# Patient Record
Sex: Male | Born: 1965 | Race: White | Hispanic: No | Marital: Married | Smoking: Current every day smoker
Health system: Southern US, Community
[De-identification: ages and names within clinical notes are randomized; demographics above are authoritative.]

## PROBLEM LIST (undated history)

## (undated) DIAGNOSIS — F411 Generalized anxiety disorder: Secondary | ICD-10-CM

## (undated) DIAGNOSIS — K219 Gastro-esophageal reflux disease without esophagitis: Secondary | ICD-10-CM

## (undated) DIAGNOSIS — F410 Panic disorder [episodic paroxysmal anxiety] without agoraphobia: Secondary | ICD-10-CM

## (undated) DIAGNOSIS — T7840XA Allergy, unspecified, initial encounter: Secondary | ICD-10-CM

## (undated) HISTORY — DX: Allergy, unspecified, initial encounter: T78.40XA

## (undated) HISTORY — DX: Gastro-esophageal reflux disease without esophagitis: K21.9

## (undated) HISTORY — DX: Generalized anxiety disorder: F41.1

## (undated) HISTORY — DX: Panic disorder (episodic paroxysmal anxiety): F41.0

## (undated) HISTORY — PX: TONSILLECTOMY AND ADENOIDECTOMY: SUR1326

## (undated) HISTORY — PX: HERNIA REPAIR: SHX51

---

## 1997-06-10 ENCOUNTER — Encounter: Payer: Self-pay | Admitting: Family Medicine

## 1997-06-10 LAB — CONVERTED CEMR LAB: Blood Glucose, Fasting: 86 mg/dL

## 1998-07-09 ENCOUNTER — Encounter: Payer: Self-pay | Admitting: Family Medicine

## 1998-07-09 LAB — CONVERTED CEMR LAB: Rapid Strep: NEGATIVE

## 2002-04-12 ENCOUNTER — Encounter: Payer: Self-pay | Admitting: Family Medicine

## 2002-04-12 LAB — CONVERTED CEMR LAB: Rapid Strep: NEGATIVE

## 2004-12-09 ENCOUNTER — Ambulatory Visit: Payer: Self-pay | Admitting: Family Medicine

## 2005-01-04 ENCOUNTER — Ambulatory Visit: Payer: Self-pay | Admitting: Family Medicine

## 2005-12-03 ENCOUNTER — Ambulatory Visit: Payer: Self-pay | Admitting: Family Medicine

## 2008-06-03 ENCOUNTER — Ambulatory Visit: Payer: Self-pay | Admitting: Family Medicine

## 2008-06-03 DIAGNOSIS — R079 Chest pain, unspecified: Secondary | ICD-10-CM | POA: Insufficient documentation

## 2008-06-04 ENCOUNTER — Encounter: Payer: Self-pay | Admitting: Family Medicine

## 2008-06-04 DIAGNOSIS — G43909 Migraine, unspecified, not intractable, without status migrainosus: Secondary | ICD-10-CM | POA: Insufficient documentation

## 2008-06-04 DIAGNOSIS — J309 Allergic rhinitis, unspecified: Secondary | ICD-10-CM | POA: Insufficient documentation

## 2008-06-12 ENCOUNTER — Ambulatory Visit: Payer: Self-pay | Admitting: Family Medicine

## 2008-06-17 ENCOUNTER — Telehealth: Payer: Self-pay | Admitting: Family Medicine

## 2008-06-21 ENCOUNTER — Ambulatory Visit: Payer: Self-pay | Admitting: Family Medicine

## 2008-06-21 DIAGNOSIS — K219 Gastro-esophageal reflux disease without esophagitis: Secondary | ICD-10-CM | POA: Insufficient documentation

## 2008-06-24 ENCOUNTER — Ambulatory Visit: Payer: Self-pay | Admitting: Family Medicine

## 2008-06-24 DIAGNOSIS — R5381 Other malaise: Secondary | ICD-10-CM

## 2008-06-24 DIAGNOSIS — R5383 Other fatigue: Secondary | ICD-10-CM

## 2008-06-25 LAB — CONVERTED CEMR LAB
ALT: 56 units/L — ABNORMAL HIGH (ref 0–53)
Albumin: 3.8 g/dL (ref 3.5–5.2)
Alkaline Phosphatase: 79 units/L (ref 39–117)
Basophils Absolute: 0 10*3/uL (ref 0.0–0.1)
Bilirubin, Direct: 0.1 mg/dL (ref 0.0–0.3)
CO2: 28 meq/L (ref 19–32)
Chloride: 109 meq/L (ref 96–112)
Creatinine, Ser: 1 mg/dL (ref 0.4–1.5)
Eosinophils Absolute: 0.5 10*3/uL (ref 0.0–0.7)
Glucose, Bld: 91 mg/dL (ref 70–99)
Lymphocytes Relative: 27 % (ref 12.0–46.0)
MCHC: 35.3 g/dL (ref 30.0–36.0)
Neutrophils Relative %: 56.3 % (ref 43.0–77.0)
RBC: 4.6 M/uL (ref 4.22–5.81)
RDW: 11.6 % (ref 11.5–14.6)
TSH: 1.08 microintl units/mL (ref 0.35–5.50)
Total CHOL/HDL Ratio: 4
Total Protein: 6.8 g/dL (ref 6.0–8.3)
Triglycerides: 90 mg/dL (ref 0.0–149.0)

## 2008-06-26 ENCOUNTER — Telehealth: Payer: Self-pay | Admitting: Family Medicine

## 2008-07-18 ENCOUNTER — Ambulatory Visit: Payer: Self-pay | Admitting: Internal Medicine

## 2008-07-19 ENCOUNTER — Ambulatory Visit: Payer: Self-pay | Admitting: Family Medicine

## 2008-07-19 DIAGNOSIS — M545 Low back pain: Secondary | ICD-10-CM

## 2009-01-02 ENCOUNTER — Ambulatory Visit: Payer: Self-pay | Admitting: Family Medicine

## 2009-02-19 ENCOUNTER — Telehealth: Payer: Self-pay | Admitting: Family Medicine

## 2009-10-20 ENCOUNTER — Encounter (INDEPENDENT_AMBULATORY_CARE_PROVIDER_SITE_OTHER): Payer: Self-pay | Admitting: *Deleted

## 2009-11-20 ENCOUNTER — Telehealth (INDEPENDENT_AMBULATORY_CARE_PROVIDER_SITE_OTHER): Payer: Self-pay | Admitting: *Deleted

## 2009-11-21 ENCOUNTER — Ambulatory Visit: Payer: Self-pay | Admitting: Family Medicine

## 2009-11-24 LAB — CONVERTED CEMR LAB
Cholesterol: 194 mg/dL (ref 0–200)
Triglycerides: 107 mg/dL (ref 0.0–149.0)

## 2009-11-28 ENCOUNTER — Ambulatory Visit: Payer: Self-pay | Admitting: Family Medicine

## 2010-04-14 NOTE — Assessment & Plan Note (Signed)
Summary: CPX/DR [facility]'S PT/CLE   Vital Signs:  Patient profile:   45 year old male Height:      67.75 inches Weight:      177.75 pounds BMI:     27.33 Temp:     98.5 degrees F oral Pulse rate:   76 / minute Pulse rhythm:   regular BP sitting:   108 / 72  (left arm) Cuff size:   regular  Vitals Entered By: Delilah Shan CMA Duncan Dull) (November 28, 2009 8:53 AM) CC: CPX (RNS), Headache   History of Present Illness: CPE- see prev med.  Migraines, under control.  Was told he didn't need to follow up at [facility].  Now with 1-2/month.  Good effect with imitrex.  H/p photo/phonophobia, pain in neck and pain over 1 eye or the other.  No aura.   Needs refill on imitrex.   Going on cruise on a month.  Needs motion sickness patch.  Going to [employer].   Allergies: No Known Drug Allergies  Past History:  Family History: Last updated: 11/28/2009 Father A   HTN:   Mother: A  SISTER A CV: POSITIVE PGF DECEASED FROM MI HBP: POSITIVE FATHER ; +PGF AND +PGM DM: + PGF AND + PGM GOUT/ARTHRITIS: PROSTATE CANCER: BREAST/OVARIAN/UTERINE CANCER: NEGATIVE DEPRESSION: NEGATIVE ETOH/DRUG ABUSE: NEGATIVE OTHER: NEGATIVE STROKE No FH of Colon Cancer:  Social History: Last updated: 11/28/2009 Marital Status: Married 1991// LIVES WITH WIFE Children: 2 CHILDREN Occupation: Scientist, research (physical sciences), INJECTION MOLD SETUP MACHINERY Patient prev smoked  Alcohol Use - yes, occ 1 Daily Caffeine Use Illicit Drug Use - no Patient does get regular exercise, walking [employer] fan  Past Medical History: Allergic rhinitis:(05/1997) Migraines H/o GERD  Past Surgical History: headache center workup :(02/14/2003)(DR. ADELMAN) Hernia Surgery: 1997 T&A as a child  Family History: Reviewed history from 07/18/2008 and no changes required. Father A   HTN:   Mother: A  SISTER A CV: POSITIVE PGF DECEASED FROM MI HBP: POSITIVE FATHER ; +PGF AND +PGM DM: + PGF AND + PGM GOUT/ARTHRITIS: PROSTATE  CANCER: BREAST/OVARIAN/UTERINE CANCER: NEGATIVE DEPRESSION: NEGATIVE ETOH/DRUG ABUSE: NEGATIVE OTHER: NEGATIVE STROKE No FH of Colon Cancer:  Social History: Reviewed history from 07/18/2008 and no changes required. Marital Status: Married 1991// LIVES WITH WIFE Children: 2 CHILDREN Occupation: [employer], INJECTION MOLD SETUP MACHINERY Patient prev smoked  Alcohol Use - yes, occ 1 Daily Caffeine Use Illicit Drug Use - no Patient does get regular exercise, walking [employer] fan  Review of Systems       See HPI.  Otherwise negative.    Physical Exam  General:  GEN: nad, alert and oriented HEENT: mucous membranes moist NECK: supple w/o LA CV: rrr.  no murmur PULM: ctab, no inc wob ABD: soft, +bs EXT: no edema SKIN: no acute rash    Impression & Recommendations:  Problem # 1:  Preventive Health Care (ICD-V70.0) Flu shot encouraged along with diet/exercise.  labs reviewed w/pt.  No indication for early prostate, colon CA screening.  patient to consider flu shot.   Scopolamine patch with routine instructions (constipation/sedation) given for upcoming trip.   Problem # 2:  MIGRAINE HEADACHE (ICD-346.90) No change in meds.  stopped smoking.  d/w patient DG:UYQI and soda consumption.  His updated medication list for this problem includes:    Imitrex 100 Mg Tabs (Sumatriptan succinate) .Marland Kitchen... As needed migraines  Complete Medication List: 1)  Imitrex 100 Mg Tabs (Sumatriptan succinate) .... As needed migraines 2)  Transderm-scop 1.5 Mg Pt72 (Scopolamine base) .Marland KitchenMarland KitchenMarland Kitchen 1  patch q3 days as needed motion sickness during cruise   Patient Instructions: 1)  I sent your meds to [facility].  Think about getting a flu shot.  Take care.  Let me know if you have other concerns.  Enjoy the cruise.  Prescriptions: IMITREX 100 MG TABS (SUMATRIPTAN SUCCINATE) as needed MIGRAINES  #9 x 12   Entered and Authorized by:   Crawford Givens MD   Signed by:   Crawford Givens MD on 11/28/2009   Method used:    Electronically to        [facility]* (retail)       [address]       [place], [place]  [address]       Ph: [phone]       Fax: [phone]   RxID:   [id number] TRANSDERM-SCOP 1.5 MG PT72 (SCOPOLAMINE BASE) 1 patch q3 days as needed motion sickness during cruise  #4 x 1   Entered and Authorized by:   Crawford Givens MD   Signed by:   Crawford Givens MD on 11/28/2009   Method used:   Electronically to        [facility]* (retail)       [address]       [place], [place]  [address]       Ph: [phone]       Fax: [phone]   RxID:   [id number]   Current Allergies (reviewed today): No known allergies

## 2010-04-14 NOTE — Progress Notes (Signed)
----   Converted from flag ---- ---- 11/19/2009 1:13 PM, Crawford Givens MD wrote: glucose lipid v70.0  ---- 11/19/2009 1:12 PM, Liane Comber CMA (AAMA) wrote: Lab orders please! Good Morning! This pt is scheduled for cpx labs Friday, which labs to draw and dx codes to use? Thanks Tasha ------------------------------

## 2010-04-14 NOTE — Letter (Signed)
Summary: David Sheppard letter  [place] [facility]  [address] [place], [place] [address]   Phone: [phone]  Fax: [phone]       10/20/2009 MRN: [id number]  David Sheppard [address] [place], [place]  [address]  Dear Mr. David Sheppard [facility] [place] [place] announce the retirement of Arta Silence, M.D., from full-time practice at the [facility] office effective September 11, 2009 and his plans of returning part-time.  It is important to Dr. Hetty Ely and to our practice that you understand that [facility] has seven physicians in our office for your health care needs.  We will continue to offer the same exceptional care that you have today.    Dr. Hetty Ely has spoken to many of you about his plans for retirement and returning part-time in the fall.   We will continue to work with you through the transition to schedule appointments for you in the office and meet the high standards that [facility] is committed to.   Again, it is with great pleasure that we share the news that Dr. Hetty Ely will return to [facility] in October of 2011 with a reduced schedule.    If you have any questions, or would like to request an appointment with one of our physicians, please call us at [phone] and press the option for Scheduling an appointment.  We take pleasure in providing you with excellent patient care and look forward to seeing you at your next office visit.  Our [facility] Physicians are:  Tillman Abide, M.D. Laurita Quint, M.D. Roxy Manns, M.D. Kerby Nora, M.D. Hannah Beat, M.D. Ruthe Mannan, M.D. We proudly welcomed Raechel Ache, M.D. and Eustaquio Boyden, M.D. to the practice in July/August 2011.  Sincerely,  [facility]

## 2010-12-04 ENCOUNTER — Other Ambulatory Visit: Payer: Self-pay | Admitting: *Deleted

## 2010-12-04 MED ORDER — SUMATRIPTAN SUCCINATE 100 MG PO TABS: 100.0000 mg | ORAL_TABLET | Freq: Once | ORAL | Status: DC | PRN

## 2010-12-04 NOTE — Telephone Encounter (Signed)
Pt has not been seen in a year.Marland Kitchen Await Dr. Loni Muse recommendations upon his return.  Likely needs CPX to be scheduled first.

## 2010-12-04 NOTE — Telephone Encounter (Signed)
Patient not seen since 08/2009.

## 2010-12-04 NOTE — Telephone Encounter (Signed)
Patient advised to schedule CPE.  Patient says he switched his care to Dr. Patsy Lager but will schedule CPE with him before next refill is needed.

## 2010-12-04 NOTE — Telephone Encounter (Signed)
Can have one month of 9/0 and see me for more, preferably a CPX with labs prior

## 2010-12-18 ENCOUNTER — Encounter: Payer: Self-pay | Admitting: Family Medicine

## 2010-12-21 ENCOUNTER — Ambulatory Visit (INDEPENDENT_AMBULATORY_CARE_PROVIDER_SITE_OTHER): Payer: BC Managed Care – PPO | Admitting: Family Medicine

## 2010-12-21 ENCOUNTER — Encounter: Payer: Self-pay | Admitting: Family Medicine

## 2010-12-21 VITALS — BP 104/70 | HR 80 | Temp 98.6°F | Ht 67.75 in | Wt 179.0 lb

## 2010-12-21 DIAGNOSIS — G43909 Migraine, unspecified, not intractable, without status migrainosus: Secondary | ICD-10-CM

## 2010-12-21 MED ORDER — SUMATRIPTAN SUCCINATE 100 MG PO TABS: 100.0000 mg | ORAL_TABLET | ORAL | Status: DC | PRN

## 2010-12-21 NOTE — Progress Notes (Signed)
  Subjective:    Patient ID: David Sheppard, male    DOB: 29-Sep-1965, 45 y.o.   MRN: [id number]  HPI  David Sheppard, a 45 y.o. male presents today in the office for the following:    Feeling fine -  Migraines are fairly stable he will have anywhere to none for several weeks to 3 a week. Sometimes correlated with allergy season as well.  The PMH, PSH, Social History, Family History, Medications, and allergies have been reviewed in [facility], and have been updated if relevant.   Review of Systems ROS: GEN: No acute illnesses, no fevers, chills. GI: No n/v/d, eating normally Pulm: No SOB Interactive and getting along well at home.  Otherwise, ROS is as per the HPI.     Objective:   Physical Exam   Physical Exam  Blood pressure 104/70, pulse 80, temperature 98.6 F (37 C), temperature source Oral, height 5' 7.75" (1.721 m), weight 179 lb (81.194 kg).  GEN: WDWN, NAD, Non-toxic, A & O x 3 HEENT: Atraumatic, Normocephalic. Neck supple. No masses, No LAD. Ears and Nose: No external deformity. CV: RRR, No M/G/R. No JVD. No thrill. No extra heart sounds. PULM: CTA B, no wheezes, crackles, rhonchi. No retractions. No resp. distress. No accessory muscle use. EXTR: No c/c/e NEURO Normal gait.  PSYCH: Normally interactive. Conversant. Not depressed or anxious appearing.  Calm demeanor.       Assessment & Plan:   1. MIGRAINE HEADACHE  SUMAtriptan (IMITREX) 100 MG tablet

## 2011-01-04 ENCOUNTER — Other Ambulatory Visit: Payer: Self-pay | Admitting: *Deleted

## 2011-01-04 DIAGNOSIS — G43909 Migraine, unspecified, not intractable, without status migrainosus: Secondary | ICD-10-CM

## 2011-01-04 MED ORDER — SUMATRIPTAN SUCCINATE 100 MG PO TABS: 100.0000 mg | ORAL_TABLET | ORAL | Status: DC | PRN

## 2011-01-04 NOTE — Telephone Encounter (Signed)
Received faxed refill request from pharmacy for Sumatriptan 100 mg, directions on request is take one tablet by mouth daily as needed for migraine. Is it okay to refill medication?

## 2011-01-04 NOTE — Telephone Encounter (Signed)
Yes

## 2011-02-23 ENCOUNTER — Encounter: Payer: Self-pay | Admitting: Family Medicine

## 2011-02-23 ENCOUNTER — Ambulatory Visit (INDEPENDENT_AMBULATORY_CARE_PROVIDER_SITE_OTHER): Payer: BC Managed Care – PPO | Admitting: Family Medicine

## 2011-02-23 VITALS — BP 120/72 | HR 78 | Temp 98.1°F | Ht 68.0 in | Wt 179.8 lb

## 2011-02-23 DIAGNOSIS — R6889 Other general symptoms and signs: Secondary | ICD-10-CM

## 2011-02-23 DIAGNOSIS — J209 Acute bronchitis, unspecified: Secondary | ICD-10-CM

## 2011-02-23 MED ORDER — AZITHROMYCIN 250 MG PO TABS: ORAL_TABLET | ORAL | Status: AC

## 2011-02-23 MED ORDER — HYDROCODONE-HOMATROPINE 5-1.5 MG/5ML PO SYRP: ORAL_SOLUTION | ORAL | Status: AC

## 2011-02-23 NOTE — Progress Notes (Signed)
  Patient Name: David Sheppard Date of Birth: [birth year] Age: 45 y.o. Medical Record Number: [id number] Gender: male  History of Present Illness:  David Sheppard is a 45 y.o. very pleasant male patient who presents with the following:  Started as a sore throat and has lingered on his chest. Sick for 2 weeks -- worst part is all in his chest. Yesterday, had a lot of sore throat and really bad congestion. A couple of times trouble sleeping at night.  Last week maybe a fever. Last week legs were aching really bad.  No n/v/d  Past Medical History, Surgical History, Social History, Family History, and Problem List have been reviewed in EHR and updated if relevant.  Review of Systems: ROS: GEN: Acute illness details above GI: Tolerating PO intake GU: maintaining adequate hydration and urination Pulm: No SOB Interactive and getting along well at home.  Otherwise, ROS is as per the HPI.  Physical Examination: Filed Vitals:   02/23/11 1523  BP: 120/72  Pulse: 78  Temp: 98.1 F (36.7 C)  TempSrc: Oral  Height: 5\' 8"  (1.727 m)  Weight: 179 lb 12.8 oz (81.557 kg)  SpO2: 98%    Body mass index is 27.34 kg/(m^2).   Gen: WDWN, NAD; A & O x3, cooperative. Pleasant.Globally Non-toxic HEENT: Normocephalic and atraumatic. Throat clear, w/o exudate, R TM clear, L TM - good landmarks, No fluid present. rhinnorhea. No frontal or maxillary sinus T. MMM NECK: Anterior cervical  LAD is present CV: RRR, No M/G/R, cap refill <2 sec PULM: Breathing comfortably in no respiratory distress. no wheezing, crackles, rhonchi ABD: S,NT,ND,+BS. No HSM. No rebound. EXT: No c/c/e PSYCH: Friendly, good eye contact MSK: Nml gait   Assessment and Plan:  1. Flu-like symptoms   2. Bronchitis, acute    Resolving flu vs bronchitis - unclear by history For now, symptomatically treat, hycodan at night. Robitussin during day.  If not resolving by weekend, fill zpak

## 2011-03-04 ENCOUNTER — Telehealth: Payer: Self-pay | Admitting: Internal Medicine

## 2011-03-04 ENCOUNTER — Other Ambulatory Visit: Payer: Self-pay | Admitting: Family Medicine

## 2011-03-04 MED ORDER — OSELTAMIVIR PHOSPHATE 75 MG PO CAPS: 75.0000 mg | ORAL_CAPSULE | Freq: Two times a day (BID) | ORAL | Status: AC

## 2011-03-04 NOTE — Telephone Encounter (Signed)
Influenza. tamiflu sent in for him

## 2011-03-04 NOTE — Telephone Encounter (Signed)
i'll take care of this myself.

## 2011-03-04 NOTE — Progress Notes (Signed)
D/w patient. Sx c/w flu onset yesterday, myalgia, cough, severe with 102 fever. tamiflu Hycodan oow

## 2011-03-04 NOTE — Telephone Encounter (Signed)
Call patient, i need more information. Did he fill his zpak, fever, other symptoms?

## 2011-03-04 NOTE — Telephone Encounter (Signed)
Patient called and stated he felt better but last night the fever, congestion is back with chills and body aches.  He would like to know what he can do.

## 2011-03-08 ENCOUNTER — Telehealth: Payer: Self-pay | Admitting: Family Medicine

## 2011-03-08 NOTE — Telephone Encounter (Signed)
Triage Record Num: [id number] Operator: Revonda Humphrey Patient Name: Tenoch Mcclure Call Date & Time: 03/06/2011 8:51:42AM Patient Phone: [phone] PCP: Hannah Beat Patient Gender: Male PCP Fax : [phone] Patient DOB: 06-02-1965 Practice Name: [facility] Reason for Call: Caller: Ricky/Patient; PCP: Hannah Beat T.; CB#: [phone]; Call regarding HA; Started fever, cough, aches, headache Wed 03/03/2011. Called and starte Tamiflu 12/20. Has been taking Motrin and Imitrex for headaches. Soreness behind eyes and hurts to blink at times. Headache essentially gone at this time. Also taking OTC Cold Rx , AlkaSeltzer, Niquil. Gave care information for Flu Sx, headache.s Protocol(s) Used: Flu-Like Symptoms Recommended Outcome per Protocol: Provide Home/Self Care Reason for Outcome: Diagnosed with influenza by provider AND has questions/concerns Care Advice: ~

## 2011-05-10 ENCOUNTER — Telehealth: Payer: Self-pay | Admitting: *Deleted

## 2011-05-10 NOTE — Telephone Encounter (Signed)
Call [phone] --- please call in AM patient needs to follow-up with me on Wed or Thursday afternoon.  I called and spoke to him. Personal.

## 2011-05-10 NOTE — Telephone Encounter (Signed)
Patient calling and requesting a phone call from physican and he did not give any details he says it is personal. Patient would like call back on cell phone before 4:30 and on home phone after 4:30pm.

## 2011-05-11 NOTE — Telephone Encounter (Signed)
Tried to reach patient and cell phone says he is unavailable right now

## 2011-05-18 ENCOUNTER — Encounter: Payer: Self-pay | Admitting: *Deleted

## 2011-05-18 NOTE — Telephone Encounter (Signed)
Letter mailed

## 2011-05-18 NOTE — Telephone Encounter (Signed)
Patient will not return my call. What should we do?

## 2011-05-18 NOTE — Telephone Encounter (Signed)
Can you mail him a letter:  We have been trying to contact you since our conversation on the telephone, but we have not been able to. I would be happy to see you in the office. Please call our office from 8 to 5 and we would be happy to schedule a follow-up at your convenience.

## 2011-07-27 ENCOUNTER — Telehealth: Payer: Self-pay

## 2011-07-27 MED ORDER — SCOPOLAMINE 1 MG/3DAYS TD PT72: 1.0000 | MEDICATED_PATCH | TRANSDERMAL | Status: DC

## 2011-07-27 NOTE — Telephone Encounter (Signed)
done

## 2011-07-27 NOTE — Telephone Encounter (Signed)
Pt going on cruise for 7 days and request trans derm scop sent to [facility] pharmacy. Pt can be reached at [phone].Pt last seen 02/23/11. (pt has uses trans derm scop before).

## 2011-07-29 ENCOUNTER — Ambulatory Visit (INDEPENDENT_AMBULATORY_CARE_PROVIDER_SITE_OTHER): Payer: BC Managed Care – PPO | Admitting: Family Medicine

## 2011-07-29 ENCOUNTER — Encounter: Payer: Self-pay | Admitting: Family Medicine

## 2011-07-29 VITALS — BP 112/80 | HR 89 | Temp 98.2°F | Ht 68.0 in | Wt 179.2 lb

## 2011-07-29 DIAGNOSIS — F411 Generalized anxiety disorder: Secondary | ICD-10-CM

## 2011-07-29 DIAGNOSIS — R5381 Other malaise: Secondary | ICD-10-CM

## 2011-07-29 DIAGNOSIS — R42 Dizziness and giddiness: Secondary | ICD-10-CM

## 2011-07-29 DIAGNOSIS — Z125 Encounter for screening for malignant neoplasm of prostate: Secondary | ICD-10-CM

## 2011-07-29 DIAGNOSIS — R5383 Other fatigue: Secondary | ICD-10-CM

## 2011-07-29 DIAGNOSIS — F410 Panic disorder [episodic paroxysmal anxiety] without agoraphobia: Secondary | ICD-10-CM

## 2011-07-29 DIAGNOSIS — Z13220 Encounter for screening for lipoid disorders: Secondary | ICD-10-CM

## 2011-07-29 MED ORDER — CITALOPRAM HYDROBROMIDE 20 MG PO TABS: 20.0000 mg | ORAL_TABLET | Freq: Every day | ORAL | Status: DC

## 2011-07-29 NOTE — Progress Notes (Signed)
  Patient Name: David Sheppard Date of Birth: 1965-10-03 Age: 46 y.o. Medical Record Number: [id number] Gender: male Date of Encounter: 07/29/2011  History of Present Illness:  David Sheppard is a 46 y.o. very pleasant male patient who presents with the following:  Has had a lot of dizziness recently. Has been fine over the weeknend, and then when to the  Had some dizzines over the weekend.  More over the weekends. Last night, has been wore that others, has been every few days.   1 month Will have some light-headed sensation. Has happened a few times after a migrain and then out of the blue.   Anx. Will come and go, gets hot. Will not last all that long. Will be worried some time.  Found out long term empl / friends leaving on a daily basis. Anxiety and nervous with her wife. Leaving town, will go for about a week. Occ will go a nigh tor two. Sweaty. Some social.   Parties are difficult.   Past Medical History, Surgical History, Social History, Family History, Problem List, Medications, and Allergies have been reviewed and updated if relevant.  Review of Systems: ROS: GEN: Acute illness details above GI: Tolerating PO intake GU: maintaining adequate hydration and urination Pulm: No SOB Interactive and getting along well at home.  Otherwise, ROS is as per the HPI.   Physical Examination: Filed Vitals:   07/29/11 1554  BP: 110/78  Pulse: 84  Temp: 98.2 F (36.8 C)  TempSrc: Oral  Height: 5\' 8"  (1.727 m)  Weight: 179 lb 4 oz (81.307 kg)  SpO2: 97%    Body mass index is 27.25 kg/(m^2).   GEN: WDWN, NAD, Non-toxic, A & O x 3 HEENT: Atraumatic, Normocephalic. Neck supple. No masses, No LAD. Ears and Nose: No external deformity. CV: RRR, No M/G/R. No JVD. No thrill. No extra heart sounds. PULM: CTA B, no wheezes, crackles, rhonchi. No retractions. No resp. distress. No accessory muscle use. EXTR: No c/c/e NEURO Normal gait.  PSYCH: Normally interactive. Conversant.  Not depressed or anxious appearing.  Calm demeanor.    Assessment and Plan:  1. Dizziness  Basic metabolic panel, CBC with Differential, TSH, Hepatic function panel  2. Screening for lipoid disorders  LDL cholesterol, direct  3. Fatigue  Hepatic function panel  4. Special screening for malignant neoplasm of prostate    5. Generalized anxiety disorder    6. Panic attacks     Significant anxiety, generalized with acute panic attacks that have been undertreated for years, now with very significant symptoms. Start SSRI  I suspect #1 may relate to this, but cannot exclude a small amount of intermittent inner ear symptoms, with a history of severe motion sickness. Dramamine prn is reasonable  Orders Today: Orders Placed This Encounter  Procedures  . Basic metabolic panel  . CBC with Differential  . TSH  . Hepatic function panel  . LDL cholesterol, direct    Medications Today: Meds ordered this encounter  Medications  . citalopram (CELEXA) 20 MG tablet    Sig: Take 1 tablet (20 mg total) by mouth daily.    Dispense:  30 tablet    Refill:  3

## 2011-07-30 ENCOUNTER — Encounter: Payer: Self-pay | Admitting: Family Medicine

## 2011-07-30 DIAGNOSIS — F411 Generalized anxiety disorder: Secondary | ICD-10-CM

## 2011-07-30 DIAGNOSIS — F410 Panic disorder [episodic paroxysmal anxiety] without agoraphobia: Secondary | ICD-10-CM

## 2011-07-30 HISTORY — DX: Panic disorder (episodic paroxysmal anxiety): F41.0

## 2011-07-30 HISTORY — DX: Generalized anxiety disorder: F41.1

## 2011-07-30 LAB — CBC WITH DIFFERENTIAL/PLATELET
Basophils Absolute: 0 10*3/uL (ref 0.0–0.1)
Eosinophils Absolute: 0.4 10*3/uL (ref 0.0–0.7)
HCT: 43.6 % (ref 39.0–52.0)
Hemoglobin: 14.8 g/dL (ref 13.0–17.0)
Lymphocytes Relative: 27.4 % (ref 12.0–46.0)
Lymphs Abs: 2 10*3/uL (ref 0.7–4.0)
MCHC: 33.9 g/dL (ref 30.0–36.0)
Neutro Abs: 4.4 10*3/uL (ref 1.4–7.7)
Platelets: 175 10*3/uL (ref 150.0–400.0)
RDW: 12.8 % (ref 11.5–14.6)

## 2011-07-30 LAB — TSH: TSH: 1.02 u[IU]/mL (ref 0.35–5.50)

## 2011-07-30 LAB — BASIC METABOLIC PANEL
BUN: 12 mg/dL (ref 6–23)
CO2: 27 mEq/L (ref 19–32)
Calcium: 9 mg/dL (ref 8.4–10.5)
GFR: 69.94 mL/min (ref >60.00)
Glucose, Bld: 73 mg/dL (ref 70–99)
Sodium: 141 mEq/L (ref 135–145)

## 2011-07-30 LAB — LDL CHOLESTEROL, DIRECT: Direct LDL: 121.9 mg/dL

## 2011-07-30 LAB — HEPATIC FUNCTION PANEL: Albumin: 4 g/dL (ref 3.5–5.2)

## 2011-08-02 ENCOUNTER — Encounter: Payer: Self-pay | Admitting: *Deleted

## 2011-09-08 ENCOUNTER — Ambulatory Visit (INDEPENDENT_AMBULATORY_CARE_PROVIDER_SITE_OTHER): Payer: BC Managed Care – PPO | Admitting: Family Medicine

## 2011-09-08 ENCOUNTER — Encounter: Payer: Self-pay | Admitting: Family Medicine

## 2011-09-08 VITALS — BP 118/80 | HR 87 | Temp 98.5°F | Ht 68.0 in | Wt 180.0 lb

## 2011-09-08 DIAGNOSIS — F411 Generalized anxiety disorder: Secondary | ICD-10-CM

## 2011-09-08 DIAGNOSIS — F410 Panic disorder [episodic paroxysmal anxiety] without agoraphobia: Secondary | ICD-10-CM

## 2011-09-08 NOTE — Progress Notes (Signed)
>  15 minutes spent in face to face time with patient, >50% spent in counselling or coordination of care: he is doing much better. The panic and anxiety is much improved. Wife noticed doing better a week or so ago. No SE. Dizziness also improved. No depression.

## 2011-10-20 ENCOUNTER — Ambulatory Visit (INDEPENDENT_AMBULATORY_CARE_PROVIDER_SITE_OTHER): Payer: BC Managed Care – PPO | Admitting: Family Medicine

## 2011-10-20 ENCOUNTER — Encounter: Payer: Self-pay | Admitting: Family Medicine

## 2011-10-20 VITALS — BP 120/74 | HR 100 | Temp 98.6°F | Ht 68.0 in | Wt 173.5 lb

## 2011-10-20 DIAGNOSIS — M542 Cervicalgia: Secondary | ICD-10-CM

## 2011-10-20 DIAGNOSIS — H6590 Unspecified nonsuppurative otitis media, unspecified ear: Secondary | ICD-10-CM

## 2011-10-20 MED ORDER — DICLOFENAC SODIUM 75 MG PO TBEC: 75.0000 mg | DELAYED_RELEASE_TABLET | Freq: Two times a day (BID) | ORAL | Status: AC

## 2011-10-20 MED ORDER — CYCLOBENZAPRINE HCL 10 MG PO TABS: 10.0000 mg | ORAL_TABLET | Freq: Three times a day (TID) | ORAL | Status: AC | PRN

## 2011-10-20 NOTE — Patient Instructions (Signed)
Sudafed for nasal decongestion Afrin for the next 3-4 days, every 12 hours Over the counter --- "swimmers ear drops"
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
## 2011-11-22 ENCOUNTER — Other Ambulatory Visit: Payer: Self-pay | Admitting: *Deleted

## 2011-11-22 MED ORDER — CITALOPRAM HYDROBROMIDE 20 MG PO TABS: 20.0000 mg | ORAL_TABLET | Freq: Every day | ORAL | Status: DC

## 2011-11-22 NOTE — Telephone Encounter (Signed)
Received faxed refill request from pharmacy. Refill sent to pharmacy electronically. 

## 2012-01-13 ENCOUNTER — Other Ambulatory Visit: Payer: Self-pay | Admitting: *Deleted

## 2012-01-13 DIAGNOSIS — G43909 Migraine, unspecified, not intractable, without status migrainosus: Secondary | ICD-10-CM

## 2012-01-13 MED ORDER — SUMATRIPTAN SUCCINATE 100 MG PO TABS: 100.0000 mg | ORAL_TABLET | ORAL | Status: DC | PRN

## 2012-03-27 ENCOUNTER — Other Ambulatory Visit: Payer: Self-pay | Admitting: *Deleted

## 2012-03-27 MED ORDER — CITALOPRAM HYDROBROMIDE 20 MG PO TABS: 20.0000 mg | ORAL_TABLET | Freq: Every day | ORAL | Status: DC

## 2012-05-31 ENCOUNTER — Encounter: Payer: Self-pay | Admitting: Family Medicine

## 2012-05-31 ENCOUNTER — Ambulatory Visit (INDEPENDENT_AMBULATORY_CARE_PROVIDER_SITE_OTHER): Payer: BC Managed Care – PPO | Admitting: Family Medicine

## 2012-05-31 VITALS — BP 120/70 | HR 80 | Temp 97.6°F | Ht 68.0 in | Wt 177.0 lb

## 2012-05-31 NOTE — Progress Notes (Signed)
[facility] at [facility] [address] [place] [address] Phone: [phone] Fax: [phone]  Date:  05/31/2012   Name:  David Sheppard   DOB:  05-Nov-1965   MRN:  [id number] Gender: male Age: 47 y.o.  Primary Physician:  Hannah Beat, MD  Evaluating MD: Hannah Beat, MD   Chief Complaint: Rash   History of Present Illness:  David Sheppard is a 47 y.o. pleasant patient who presents with the following:  1 week.  Fungal rash in his groin region, but more distal in the upper, inner thighs. Irritating. Has used OTC creams but for only 3 days.  lotrimin  Patient Active Problem List  Diagnosis  . MIGRAINE HEADACHE  . ALLERGIC RHINITIS  . GERD  . OTHER MALAISE AND FATIGUE  . Generalized anxiety disorder  . Panic attacks    Past Medical History  Diagnosis Date  . Allergy   . GERD (gastroesophageal reflux disease)   . Migraine   . Generalized anxiety disorder 07/30/2011  . Panic attacks 07/30/2011    Past Surgical History  Procedure Laterality Date  . Hernia repair    . Tonsillectomy and adenoidectomy      History   Social History  . Marital Status: Married    Spouse Name: N/A    Number of Children: 2  . Years of Education: N/A   Occupational History  . injection mold setup machinery     Social History Main Topics  . Smoking status: Former Games developer  . Smokeless tobacco: Former Neurosurgeon     Comment: quit 2009  . Alcohol Use: Yes  . Drug Use: No  . Sexually Active: Not on file   Other Topics Concern  . Not on file   Social History Narrative   1 daily caffiene use   Regular exercise-walking          Family History  Problem Relation Age of Onset  . Hypertension Father   . Hyperlipidemia Paternal Grandmother   . Diabetes Paternal Grandmother   . Heart disease Paternal Grandfather   . Hyperlipidemia Paternal Grandfather   . Diabetes Paternal Grandfather     No Known Allergies  Medication list has been reviewed and  updated.  Outpatient Prescriptions Prior to Visit  Medication Sig Dispense Refill  . citalopram (CELEXA) 20 MG tablet Take 1 tablet (20 mg total) by mouth daily.  30 tablet  3  . SUMAtriptan (IMITREX) 100 MG tablet Take 1 tablet (100 mg total) by mouth every 2 (two) hours as needed for migraine.  9 tablet  6  . diclofenac (VOLTAREN) 75 MG EC tablet Take 1 tablet (75 mg total) by mouth 2 (two) times daily.  60 tablet  0   No facility-administered medications prior to visit.    Review of Systems:   GEN: No acute illnesses, no fevers, chills. GI: No n/v/d, eating normally Pulm: No SOB Interactive and getting along well at home.  Otherwise, ROS is as per the HPI.   Physical Examination: BP 120/70  Pulse 80  Temp(Src) 97.6 F (36.4 C) (Oral)  Ht 5\' 8"  (1.727 m)  Wt 177 lb (80.287 kg)  BMI 26.92 kg/m2  SpO2 98%  Ideal Body Weight: Weight in (lb) to have BMI = 25: 164.1   GEN: WDWN, NAD, Non-toxic, Alert & Oriented x 3 HEENT: Atraumatic, Normocephalic.  Ears and Nose: No external deformity. EXTR: No clubbing/cyanosis/edema NEURO: Normal gait.  PSYCH: Normally interactive. Conversant. Not depressed or anxious appearing.  Calm demeanor.  SKIN: flat, pinkish rash inner thighs  Assessment and Plan:  Fungal dermatitis  Lotrimin bid  Orders Today:  No orders of the defined types were placed in this encounter.    Updated Medication List: (Includes new medications, updates to list, dose adjustments) No orders of the defined types were placed in this encounter.    Medications Discontinued: There are no discontinued medications.    Signed, Elpidio Galea. Alfonsa Vaile, MD 05/31/2012 10:38 AM

## 2012-06-05 ENCOUNTER — Telehealth: Payer: Self-pay | Admitting: Family Medicine

## 2012-06-05 MED ORDER — NAFTIFINE HCL 1 % EX CREA: TOPICAL_CREAM | Freq: Every day | CUTANEOUS | Status: DC

## 2012-06-05 NOTE — Telephone Encounter (Signed)
Sent in -- let me know if not getting better within a couple weeks, and we will have to do oral pills   Hannah Beat, MD 06/05/2012, 2:07 PM

## 2012-06-05 NOTE — Telephone Encounter (Signed)
Patient Information:  Caller Name: [place]  Phone: [phone]  Patient: David Sheppard, David Sheppard  Gender: Male  DOB: February 26, 1966  Age: 47 Years  PCP: Hannah Beat (Family Practice)  Office Follow Up:  Does the office need to follow up with this patient?: Yes  Instructions For The Office: OFFICE PLEASE FOLLOW UP WITH PATIENT IF THERE IS A STRONGER CREAM THAT CAN BE CALLED IN FOR PT.  RN Note:  RN advised for pt to continue the treatment and to take an OTC Benadryl 25mg  capsule for the itching  Symptoms  Reason For Call & Symptoms: Pt was seen in the office last week (05/31/12) for a rash inside thighs.  Pt states the rash is more irritated.  Pt reports the rash is itchy  Reviewed Health History In EMR: Yes  Reviewed Medications In EMR: Yes  Reviewed Allergies In EMR: Yes  Reviewed Surgeries / Procedures: Yes  Date of Onset of Symptoms: 05/31/2012  Treatments Tried: Lotrimin, Tinactin  Treatments Tried Worked: No  Guideline(s) Used:  Rash or Redness - Localized  Jock Itch  Disposition Per Guideline:   Home Care  Reason For Disposition Reached:   Mild Jock Itch in a male  Advice Given:  Antifungal Cream for Treatment of Jock Itch.  Available over-the-counter in [place] as clotrimazole (Lotrimin AF) or miconazole (Micatin, Monistat-Derm).  Expected Course:  The rash should clear up completely in 2-3 weeks.  Call Back If:   Rash is not improving after 1 week on treatment  You become worse.  RN Overrode Recommendation:  Patient Requests Prescription  Pt is requesting if there is any cream that can be called in that may help the rash.  Pt uses [facility] [phone]

## 2012-06-06 NOTE — Telephone Encounter (Signed)
Patient advised.

## 2012-07-24 ENCOUNTER — Other Ambulatory Visit: Payer: Self-pay | Admitting: *Deleted

## 2012-07-24 DIAGNOSIS — G43909 Migraine, unspecified, not intractable, without status migrainosus: Secondary | ICD-10-CM

## 2012-07-24 MED ORDER — SUMATRIPTAN SUCCINATE 100 MG PO TABS: 100.0000 mg | ORAL_TABLET | ORAL | Status: DC | PRN

## 2012-07-24 MED ORDER — CITALOPRAM HYDROBROMIDE 20 MG PO TABS: 20.0000 mg | ORAL_TABLET | Freq: Every day | ORAL | Status: DC

## 2012-08-11 ENCOUNTER — Other Ambulatory Visit: Payer: Self-pay | Admitting: *Deleted

## 2012-08-11 DIAGNOSIS — G43909 Migraine, unspecified, not intractable, without status migrainosus: Secondary | ICD-10-CM

## 2012-08-11 MED ORDER — SUMATRIPTAN SUCCINATE 100 MG PO TABS: 100.0000 mg | ORAL_TABLET | ORAL | Status: DC | PRN

## 2012-09-01 ENCOUNTER — Other Ambulatory Visit: Payer: Self-pay | Admitting: Family Medicine

## 2012-09-02 MED ORDER — CITALOPRAM HYDROBROMIDE 20 MG PO TABS: 20.0000 mg | ORAL_TABLET | Freq: Every day | ORAL | Status: DC

## 2012-09-19 ENCOUNTER — Other Ambulatory Visit: Payer: Self-pay | Admitting: Family Medicine

## 2012-09-19 DIAGNOSIS — G43909 Migraine, unspecified, not intractable, without status migrainosus: Secondary | ICD-10-CM

## 2012-09-19 MED ORDER — SUMATRIPTAN SUCCINATE 100 MG PO TABS: 100.0000 mg | ORAL_TABLET | ORAL | Status: DC | PRN

## 2012-09-19 NOTE — Telephone Encounter (Signed)
Refilled.   Hannah Beat, MD 09/19/2012, 1:26 PM   Let him know

## 2012-09-19 NOTE — Telephone Encounter (Signed)
I called home # regarding a pt of Dr. Royden Purl and pt David Sheppard) requested a refill of his Imitrex, pt would like a call from Dr. Cyndie Chime assistant once it's done

## 2012-09-19 NOTE — Telephone Encounter (Signed)
Patient advised via message on home machine that medication sent to pharmacy

## 2013-02-01 ENCOUNTER — Other Ambulatory Visit: Payer: Self-pay | Admitting: Family Medicine

## 2013-02-01 NOTE — Telephone Encounter (Signed)
Last office visit 05/31/2012.  Ok to refill? 

## 2013-03-21 ENCOUNTER — Other Ambulatory Visit: Payer: Self-pay | Admitting: Family Medicine

## 2013-03-21 NOTE — Telephone Encounter (Signed)
Last office visit 05/31/2012.  Ok to refill?

## 2013-07-04 ENCOUNTER — Encounter: Payer: Self-pay | Admitting: Family Medicine

## 2013-07-04 ENCOUNTER — Ambulatory Visit (INDEPENDENT_AMBULATORY_CARE_PROVIDER_SITE_OTHER): Payer: BC Managed Care – PPO | Admitting: Family Medicine

## 2013-07-04 VITALS — BP 108/76 | HR 73 | Temp 97.8°F | Ht 69.0 in | Wt 173.2 lb

## 2013-07-04 DIAGNOSIS — M542 Cervicalgia: Secondary | ICD-10-CM

## 2013-07-04 MED ORDER — SCOPOLAMINE 1 MG/3DAYS TD PT72: 1.0000 | MEDICATED_PATCH | TRANSDERMAL | Status: DC

## 2013-07-04 MED ORDER — PREDNISONE 20 MG PO TABS: ORAL_TABLET | ORAL | Status: DC

## 2013-07-04 MED ORDER — AMITRIPTYLINE HCL 10 MG PO TABS: 10.0000 mg | ORAL_TABLET | Freq: Every day | ORAL | Status: DC

## 2013-07-04 NOTE — Progress Notes (Signed)
Pre visit review using our clinic review tool, if applicable. No additional management support is needed unless otherwise documented below in the visit note. 

## 2013-07-04 NOTE — Patient Instructions (Signed)
Alleve 2 tabs by mouth two times a day over the counter: Take at least for 2 - 3 weeks. This is equal to a prescripton strength dose (GENERIC CHEAPER EQUIVALENT IS NAPROXEN SODIUM)   REFERRALS TO SPECIALISTS, SPECIAL TESTS (MRI, CT, ULTRASOUNDS)  GO THE WAITING ROOM AND TELL CHECK IN YOU NEED HELP WITH A REFERRAL. Either MARION or LINDA will help you set it up.  If it is between 1-2 PM they may be at lunch.  After 5 PM, they will likely be at home.  They will call you, so please make sure the office has your correct phone number.  Referrals sometimes can be done same day if urgent, but others can take 2 or 3 days to get an appointment. Starting in 2015, many of the new Medicare insurance plans and Affordable Care Act (Obamacare) Health plans offered take much longer for referrals. They have added additional paperwork and steps.  MRI's and CT's can take up to a week for the test. (Emergencies like strokes take precedence. I will tell you if you have an emergency.)   If your referral is to an [facility] office, their office may contact you directly prior to our office reaching you.  -- Examples:  Cardiology, [facility], [facility], [facility]            Neurology, [facility], and many more.  Specialist appointment times vary a great deal, mostly on the specialist's schedule and if they have openings. -- Our office tries to get you in as fast as possible. -- Some specialists have very long wait times. (Example. Dermatology. Usually months) -- If you have a true emergency like new cancer, we work to get you in ASAP.
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
## 2013-07-19 ENCOUNTER — Telehealth: Payer: Self-pay

## 2013-07-19 NOTE — Telephone Encounter (Signed)
Pt is in [place] and forgot to bring the transderm scop with him on his trip; pt request refill. Advised pt there are refills on transderm scop at [facility] and pt will have pharmacy in [place] contact [place] to get refill transferred.

## 2013-07-28 LAB — COMPREHENSIVE METABOLIC PANEL
ALK PHOS: 85 U/L
ANION GAP: 3 — AB (ref 7–16)
Albumin: 2.4 g/dL — ABNORMAL LOW (ref 3.4–5.0)
BUN: 31 mg/dL — AB (ref 7–18)
Bilirubin,Total: 0.1 mg/dL — ABNORMAL LOW (ref 0.2–1.0)
Calcium, Total: 7.2 mg/dL — ABNORMAL LOW (ref 8.5–10.1)
Chloride: 111 mmol/L — ABNORMAL HIGH (ref 98–107)
Co2: 25 mmol/L (ref 21–32)
Creatinine: 0.84 mg/dL (ref 0.60–1.30)
EGFR (African American): >60
Glucose: 99 mg/dL (ref 65–99)
Osmolality: 284 (ref 275–301)
Potassium: 3.8 mmol/L (ref 3.5–5.1)
SGOT(AST): 101 U/L — ABNORMAL HIGH (ref 15–37)
SGPT (ALT): 107 U/L — ABNORMAL HIGH (ref 12–78)
Sodium: 139 mmol/L (ref 136–145)
TOTAL PROTEIN: 4.7 g/dL — AB (ref 6.4–8.2)

## 2013-07-28 LAB — CBC
HCT: 19.8 % — AB (ref 40.0–52.0)
HGB: 6.7 g/dL — ABNORMAL LOW (ref 13.0–18.0)
MCH: 32.7 pg (ref 26.0–34.0)
MCHC: 34.1 g/dL (ref 32.0–36.0)
MCV: 96 fL (ref 80–100)
PLATELETS: 136 10*3/uL — AB (ref 150–440)
RBC: 2.06 10*6/uL — AB (ref 4.40–5.90)
RDW: 12.4 % (ref 11.5–14.5)
WBC: 7.7 10*3/uL (ref 3.8–10.6)

## 2013-07-28 LAB — LIPASE, BLOOD: Lipase: 343 U/L (ref 73–393)

## 2013-07-29 ENCOUNTER — Inpatient Hospital Stay: Payer: Self-pay | Admitting: Internal Medicine

## 2013-07-29 LAB — URINALYSIS, COMPLETE
Bilirubin,UR: NEGATIVE
Blood: NEGATIVE
Glucose,UR: NEGATIVE mg/dL (ref 0–75)
Ketone: NEGATIVE
Leukocyte Esterase: NEGATIVE
NITRITE: NEGATIVE
PH: 6 (ref 4.5–8.0)
Protein: NEGATIVE
SPECIFIC GRAVITY: 1.017 (ref 1.003–1.030)
Squamous Epithelial: NONE SEEN
WBC UR: <1 /HPF (ref 0–5)

## 2013-07-29 LAB — ACETAMINOPHEN LEVEL: ACETAMINOPHEN: 6 ug/mL — AB

## 2013-07-29 LAB — HEMOGLOBIN: HGB: 7.3 g/dL — ABNORMAL LOW (ref 13.0–18.0)

## 2013-07-29 LAB — SALICYLATE LEVEL: Salicylates, Serum: <1.7 mg/dL

## 2013-07-30 LAB — CBC WITH DIFFERENTIAL/PLATELET
Basophil #: 0 10*3/uL (ref 0.0–0.1)
Basophil %: 0.6 %
EOS ABS: 0.2 10*3/uL (ref 0.0–0.7)
EOS PCT: 4.1 %
HCT: 19.4 % — ABNORMAL LOW (ref 40.0–52.0)
HGB: 6.5 g/dL — ABNORMAL LOW (ref 13.0–18.0)
LYMPHS ABS: 1.3 10*3/uL (ref 1.0–3.6)
LYMPHS PCT: 26.6 %
MCH: 31.7 pg (ref 26.0–34.0)
MCHC: 33.6 g/dL (ref 32.0–36.0)
MCV: 94 fL (ref 80–100)
MONO ABS: 0.3 x10 3/mm (ref 0.2–1.0)
MONOS PCT: 7 %
NEUTROS ABS: 3.1 10*3/uL (ref 1.4–6.5)
NEUTROS PCT: 61.7 %
Platelet: 108 10*3/uL — ABNORMAL LOW (ref 150–440)
RBC: 2.06 10*6/uL — AB (ref 4.40–5.90)
RDW: 13.9 % (ref 11.5–14.5)
WBC: 5 10*3/uL (ref 3.8–10.6)

## 2013-07-30 LAB — BASIC METABOLIC PANEL
ANION GAP: 0 — AB (ref 7–16)
BUN: 8 mg/dL (ref 7–18)
CALCIUM: 7 mg/dL — AB (ref 8.5–10.1)
Chloride: 118 mmol/L — ABNORMAL HIGH (ref 98–107)
Co2: 25 mmol/L (ref 21–32)
Creatinine: 0.87 mg/dL (ref 0.60–1.30)
EGFR (African American): >60
EGFR (Non-African Amer.): >60
GLUCOSE: 104 mg/dL — AB (ref 65–99)
Osmolality: 284 (ref 275–301)
Potassium: 3.7 mmol/L (ref 3.5–5.1)
SODIUM: 143 mmol/L (ref 136–145)

## 2013-07-30 LAB — PROTIME-INR
INR: 1.1
Prothrombin Time: 13.6 secs (ref 11.5–14.7)

## 2013-07-30 LAB — HEMOGLOBIN: HGB: 8.8 g/dL — ABNORMAL LOW (ref 13.0–18.0)

## 2013-07-31 LAB — CBC WITH DIFFERENTIAL/PLATELET
BASOS PCT: 1 %
Basophil #: 0.1 10*3/uL (ref 0.0–0.1)
EOS PCT: 4 %
Eosinophil #: 0.3 10*3/uL (ref 0.0–0.7)
HCT: 24.6 % — AB (ref 40.0–52.0)
HGB: 8.5 g/dL — AB (ref 13.0–18.0)
LYMPHS ABS: 1.6 10*3/uL (ref 1.0–3.6)
LYMPHS PCT: 25 %
MCH: 32.7 pg (ref 26.0–34.0)
MCHC: 34.7 g/dL (ref 32.0–36.0)
MCV: 94 fL (ref 80–100)
MONO ABS: 0.5 x10 3/mm (ref 0.2–1.0)
Monocyte %: 7.5 %
NEUTROS ABS: 4.1 10*3/uL (ref 1.4–6.5)
NEUTROS PCT: 62.5 %
Platelet: 157 10*3/uL (ref 150–440)
RBC: 2.61 10*6/uL — ABNORMAL LOW (ref 4.40–5.90)
RDW: 13.8 % (ref 11.5–14.5)
WBC: 6.5 10*3/uL (ref 3.8–10.6)

## 2013-08-04 ENCOUNTER — Other Ambulatory Visit: Payer: Self-pay | Admitting: Family Medicine

## 2013-08-15 ENCOUNTER — Ambulatory Visit: Payer: BC Managed Care – PPO | Admitting: Family Medicine

## 2013-08-17 ENCOUNTER — Encounter: Payer: Self-pay | Admitting: Family Medicine

## 2013-10-05 ENCOUNTER — Other Ambulatory Visit: Payer: Self-pay | Admitting: Family Medicine

## 2013-11-17 ENCOUNTER — Other Ambulatory Visit: Payer: Self-pay | Admitting: Family Medicine

## 2013-12-19 ENCOUNTER — Telehealth: Payer: Self-pay

## 2013-12-19 MED ORDER — PANTOPRAZOLE SODIUM 40 MG PO TBEC: 40.0000 mg | DELAYED_RELEASE_TABLET | Freq: Every day | ORAL | Status: DC

## 2013-12-19 NOTE — Telephone Encounter (Signed)
Pt called for appt to f/u [facility] hospitalization mid may for endoscopy and med refills. Pt was given med for stomach pain and heart burn when d/c from [facility]; pt cannot remember name of med but took med for 30 days and it seemed to help stomach issues. From media tab; [facility] note could be protonix. Pt wondered if could get refill of med (? Protonix) to [facility] or should pt wait to see Dr Patsy Lager on 12/26/13. Pt request cb.

## 2013-12-19 NOTE — Telephone Encounter (Signed)
Ok to refill protonix 40 mg, 1 po daily  Electronically prescribe to the pharmacy of their choice. (May call in if pharmacy does not participate in electronic prescriptions) Call in #30, 11 refills. OR if they prefer a 90 day supply, #90 with 3 refills is OK, too Prescription instructions above

## 2013-12-19 NOTE — Telephone Encounter (Signed)
David Sheppard notified prescription for Protonix has been sent in to [facility].

## 2013-12-26 ENCOUNTER — Other Ambulatory Visit: Payer: Self-pay | Admitting: Family Medicine

## 2013-12-26 ENCOUNTER — Encounter: Payer: Self-pay | Admitting: Family Medicine

## 2013-12-26 ENCOUNTER — Ambulatory Visit (INDEPENDENT_AMBULATORY_CARE_PROVIDER_SITE_OTHER): Payer: BC Managed Care – PPO | Admitting: Family Medicine

## 2013-12-26 VITALS — BP 104/72 | HR 82 | Temp 97.7°F | Ht 69.0 in | Wt 178.5 lb

## 2013-12-26 DIAGNOSIS — K2961 Other gastritis with bleeding: Secondary | ICD-10-CM | POA: Diagnosis not present

## 2013-12-26 DIAGNOSIS — R740 Nonspecific elevation of levels of transaminase and lactic acid dehydrogenase [LDH]: Secondary | ICD-10-CM

## 2013-12-26 DIAGNOSIS — R7401 Elevation of levels of liver transaminase levels: Secondary | ICD-10-CM

## 2013-12-26 DIAGNOSIS — D509 Iron deficiency anemia, unspecified: Secondary | ICD-10-CM | POA: Diagnosis not present

## 2013-12-26 LAB — HEPATIC FUNCTION PANEL
ALK PHOS: 98 U/L (ref 39–117)
ALT: 55 U/L — AB (ref 0–53)
AST: 36 U/L (ref 0–37)
Albumin: 3.6 g/dL (ref 3.5–5.2)
Bilirubin, Direct: 0.1 mg/dL (ref 0.0–0.3)
TOTAL PROTEIN: 7.6 g/dL (ref 6.0–8.3)
Total Bilirubin: 0.8 mg/dL (ref 0.2–1.2)

## 2013-12-26 LAB — CBC WITH DIFFERENTIAL/PLATELET
BASOS PCT: 0.3 % (ref 0.0–3.0)
Basophils Absolute: 0 10*3/uL (ref 0.0–0.1)
EOS PCT: 3.6 % (ref 0.0–5.0)
Eosinophils Absolute: 0.2 10*3/uL (ref 0.0–0.7)
HCT: 43.9 % (ref 39.0–52.0)
Hemoglobin: 14.8 g/dL (ref 13.0–17.0)
LYMPHS ABS: 1.5 10*3/uL (ref 0.7–4.0)
Lymphocytes Relative: 25.3 % (ref 12.0–46.0)
MCHC: 33.6 g/dL (ref 30.0–36.0)
MCV: 92.6 fl (ref 78.0–100.0)
MONO ABS: 0.5 10*3/uL (ref 0.1–1.0)
Monocytes Relative: 9.2 % (ref 3.0–12.0)
Neutro Abs: 3.6 10*3/uL (ref 1.4–7.7)
Neutrophils Relative %: 61.6 % (ref 43.0–77.0)
PLATELETS: 183 10*3/uL (ref 150.0–400.0)
RBC: 4.75 Mil/uL (ref 4.22–5.81)
RDW: 13.6 % (ref 11.5–15.5)
WBC: 5.8 10*3/uL (ref 4.0–10.5)

## 2013-12-26 LAB — BASIC METABOLIC PANEL
BUN: 10 mg/dL (ref 6–23)
CHLORIDE: 104 meq/L (ref 96–112)
CO2: 26 meq/L (ref 19–32)
Calcium: 9.3 mg/dL (ref 8.4–10.5)
Creatinine, Ser: 1 mg/dL (ref 0.4–1.5)
GFR: 85.38 mL/min (ref >60.00)
Glucose, Bld: 86 mg/dL (ref 70–99)
POTASSIUM: 4.2 meq/L (ref 3.5–5.1)
SODIUM: 138 meq/L (ref 135–145)

## 2013-12-26 LAB — IBC PANEL
Iron: 128 ug/dL (ref 42–165)
Saturation Ratios: 30.8 % (ref 20.0–50.0)
Transferrin: 296.7 mg/dL (ref 212.0–360.0)

## 2013-12-26 LAB — FERRITIN: Ferritin: 48.3 ng/mL (ref 22.0–322.0)

## 2013-12-26 MED ORDER — CITALOPRAM HYDROBROMIDE 20 MG PO TABS: ORAL_TABLET | ORAL | Status: DC

## 2013-12-26 NOTE — Progress Notes (Signed)
Dr. Karleen Hampshire T. Ikhlas Albo, MD, CAQ Sports Medicine [facility] [address] [place], [address] Phone: [phone] Fax: [phone]  12/26/2013  Patient: David Sheppard, MRN: [id number], DOB: December 11, 1965, 48 y.o.  Primary Physician:  Hannah Beat, MD  Chief Complaint: Hospitalization Follow-up and Medication Refill  Subjective:   David Sheppard is a 48 y.o. very pleasant male patient who presents with the following:  Hosp f/u: 07/2013, melena, 3 u prbc  Admitted: 07/29/2013 Discharged: 07/31/2013  Cancelled his f/u initially, and here now with rekindled GERD symptoms.   He had a Hgb down to 6.7 in the hospital and required 3 units of PRBCS He took iron for 1 month after discharge.   He also had AST and ALT > 100. Platelets were < 150,000  He is feeling ok other than GERD symptoms He also had endoscopy in the hospital  No imaging studies.  Past Medical History, Surgical History, Social History, Family History, Problem List, Medications, and Allergies have been reviewed and updated if relevant.   GEN: No acute illnesses, no fevers, chills. GI: GERD sx Pulm: No SOB Interactive and getting along well at home.  Otherwise, ROS is as per the HPI.  Objective:   BP 104/72  Pulse 82  Temp(Src) 97.7 F (36.5 C) (Oral)  Ht 5\' 9"  (1.753 m)  Wt 178 lb 8 oz (80.967 kg)  BMI 26.35 kg/m2  SpO2 95%  GEN: WDWN, NAD, Non-toxic, A & O x 3 HEENT: Atraumatic, Normocephalic. Neck supple. No masses, No LAD. Ears and Nose: No external deformity. CV: RRR, No M/G/R. No JVD. No thrill. No extra heart sounds. PULM: CTA B, no wheezes, crackles, rhonchi. No retractions. No resp. distress. No accessory muscle use. ABD: S, NT, ND, + BS, No rebound, No HSM  EXTR: No c/c/e NEURO Normal gait.  PSYCH: Normally interactive. Conversant. Not depressed or anxious appearing.  Calm demeanor.   Laboratory and Imaging Data:  Assessment and Plan:    Gastrointestinal hemorrhage associated with other gastritis - Plan: CBC with Differential, Ferritin, IBC panel  Anemia, iron deficiency - Plan: CBC with Differential, Ferritin, IBC panel  Elevated transaminase level - Plan: Basic metabolic panel, Hepatic function panel  F/u GI bleed labs, iron level. F/u AST and ALT elevation  Further work-up depending on hospital f-u work-up  Follow-up: depending on labs  New Prescriptions   No medications on file   Orders Placed This Encounter  Procedures  . CBC with Differential  . Ferritin  . IBC panel  . Basic metabolic panel  . Hepatic function panel    Signed,  T. Shenekia Riess, MD   Patient's Medications  New Prescriptions   No medications on file  Previous Medications   PANTOPRAZOLE (PROTONIX) 40 MG TABLET    Take 1 tablet (40 mg total) by mouth daily.   SUMATRIPTAN (IMITREX) 100 MG TABLET    TAKE ONE TABLET AS NEEDED FOR MIGRAINE. MAY REPEAT IN TWO HOURS IF NEEDED *MAX OF 2 TABLETS IN 24 HOURS*  Modified Medications   Modified Medication Previous Medication   CITALOPRAM (CELEXA) 20 MG TABLET citalopram (CELEXA) 20 MG tablet      TAKE 1 TABLET BY MOUTH DAILY    TAKE 1 TABLET BY MOUTH DAILY  Discontinued Medications   AMITRIPTYLINE (ELAVIL) 10 MG TABLET    Take 1 tablet (10 mg total) by mouth at bedtime.   NAFTIFINE (NAFTIN) 1 % CREAM    Apply topically daily.  PREDNISONE (DELTASONE) 20 MG TABLET    2 tabs po for 5 days, then 1 tab po for 5 days   SCOPOLAMINE (TRANSDERM-SCOP) 1 MG/3DAYS    Place 1 patch (1.5 mg total) onto the skin every 3 (three) days.

## 2013-12-26 NOTE — Progress Notes (Signed)
Pre visit review using our clinic review tool, if applicable. No additional management support is needed unless otherwise documented below in the visit note. 

## 2014-01-21 ENCOUNTER — Telehealth: Payer: Self-pay

## 2014-01-21 NOTE — Telephone Encounter (Signed)
Can you write him a note?  The only thing I can say is the date of his last appointment, and that he related to me that he had a migraine on 01/18/2014.

## 2014-01-21 NOTE — Telephone Encounter (Signed)
Letter written as instructed by Dr. Patsy Lager,  [name] notified letter is ready to be picked up at front desk.

## 2014-01-21 NOTE — Telephone Encounter (Signed)
Pt left v/m;pt was last seen 12/26/13; pt was out of work on 01/18/14 for migraine h/a;pt took imitrex but pt employer requiring note due to being out of work on 01/18/14. Pt request cb.

## 2014-04-12 ENCOUNTER — Other Ambulatory Visit: Payer: Self-pay | Admitting: Family Medicine

## 2014-07-03 ENCOUNTER — Encounter: Payer: BC Managed Care – PPO | Admitting: Family Medicine

## 2014-07-03 ENCOUNTER — Telehealth: Payer: Self-pay | Admitting: Family Medicine

## 2014-07-03 NOTE — Telephone Encounter (Signed)
This patient is an adult, and ultimately is responsible for his own healthcare. I will leave the patient's follow-up to his own discretion - he was called the night before his appointment to remind him.

## 2014-07-03 NOTE — Telephone Encounter (Signed)
Patient did not come in for their appointment today for CPE .  Please let me know if patient needs to be contacted immediately for follow up or no follow up needed.

## 2014-07-06 NOTE — Discharge Summary (Signed)
PATIENT NAME:  David Sheppard, CLAW MR#:  [id number] DATE OF BIRTH:  Jul 19, 1965  DATE OF ADMISSION:  07/29/2013 DATE OF DISCHARGE:  07/31/2013  ADMITTING PHYSICIAN: Susa Griffins, MD  DISCHARGING PHYSICIAN: Enid Baas, MD  PRIMARY CARE PHYSICIAN: Dr. Karleen Hampshire Copland  CONSULTATIONS IN THE HOSPITAL: GI consultation by Dr. Lutricia Feil.  DISCHARGE DIAGNOSES: 1.  Acute on chronic anemia secondary to gastrointestinal bleed status post 3 unit transfusion.  2.  Gastritis, duodenal ulcers.  3.  Migraine headaches.  4.  Chronic neck pain.   DISCHARGE HOME MEDICATIONS: 1.  Protonix 40 mg p.o. b.i.d.  2.  Sumatriptan 100 mg as needed for migraines.  3.  Celexa 20 mg daily.  4.  Amitriptyline 10 mg at bedtime.  5.  Ferrous sulfate 325 mg 1 tablet daily.   DISCHARGE DIET: Regular diet.   DISCHARGE ACTIVITY: As tolerated.   FOLLOWUP INSTRUCTIONS: PCP follow-up in 2 weeks.   Advised no BC Powder, Motrin or Aleve.  LABS AND IMAGING STUDIES PRIOR TO DISCHARGE: WBC 6.5, hemoglobin 8.5, hematocrit 24.6, platelet count 157,000.   Sodium 143, potassium 3.7, chloride 118, bicarb 25, BUN 8, creatinine 0.87, glucose 104 and calcium 7. INR 1.1. Urinalysis negative for any infection.   BRIEF HOSPITAL COURSE: Mr. Smallman is a 49 year old male who has migraine headaches and chronic neck pain, takes BC Powder on a regular basis and also drinks alcohol moderately, presented to the hospital secondary to weakness, dizziness and melena and noted to be anemic.   Acute on chronic anemia requiring 3 units of packed RBC transfusion this admission:  Hemoglobin on admission was around 6 and improved up to 8.5 at discharge. The patient is not symptomatic at this time. He did have an EGD done by GI and that showed gastritis, duodenal nonbleeding ulcers. He was on octreotide drip initially with his alcohol history, however, no varices were seen. He was also on Protonix drip and then changed over to p.o. Protonix b.i.d.  at this time. He was strongly advised to stay away from alcohol, BC Powder, and NSAIDs at this time. Outpatient GI followup is recommended. The patient did well and all his other home medications were continued at the time of discharge.   DISCHARGE CONDITION: Stable.  DISCHARGE DISPOSITION: Home.  TIME SPENT ON DISCHARGE: 40 minutes.  ____________________________ Enid Baas, MD rk:sb D: 07/31/2013 13:55:36 ET T: 07/31/2013 14:29:57 ET JOB#: [id number]  cc: Enid Baas, MD, <Dictator>  Enid Baas MD ELECTRONICALLY SIGNED 08/09/2013 12:24

## 2014-07-06 NOTE — H&P (Signed)
PATIENT NAME:  David Sheppard, David Sheppard MR#:  [id number] DATE OF BIRTH:  January 12, 1966  DATE OF ADMISSION:  07/29/2013  PRIMARY CARE PHYSICIAN:  Dr. Karleen Hampshire Copland.   REFERRING PHYSICIAN:  Dr. Chiquita Loth.   CHIEF COMPLAINT:  Syncope and black stools.   HISTORY OF PRESENT ILLNESS:  David Sheppard is a 49 year old male with history of migraine headaches and chronic neck pain who uses Goody Powder 2 to 3 times a day and drinks alcohol over the weekend, comes to the Emergency Department after having an episode of syncope.  The patient went on a cruise, noticed to have nausea about 3 to 4 days back.  Two days back had one episode of vomiting of what he ate.  He did not see any blood; however, noticed to have black stools for the last two days, 1 to 2 times a day.  The patient came back from the cruise, stood up and had an episode of syncope.  Concerning this, the patient is brought to the Emergency Department.  Work-up in the Emergency Department, the patient is found to have a hemoglobin of 6.7, MCV 96.  The patient denies having any abdominal pain.  Had some nausea.  The patient states has been taking this for many years.  The patient has elevated BUN of 31.   PAST MEDICAL HISTORY: 1.  Migraine headaches.  2.  Chronic neck pain.   PAST SURGICAL HISTORY:  Hernia repair.   ALLERGIES:  No known drug allergies.   HOME MEDICATIONS:  1.  Sumatriptan 100 mg once a day as needed.  2.  Citalopram 20 mg once a day.  3.  Amitriptyline 10 mg at bedtime.   SOCIAL HISTORY:  Uses vaporized cigarettes, drinks 4 to 5 beers over the weekend.  Denies using any illicit drugs.  Works as a Curator.   FAMILY HISTORY:  Both parents are healthy.  No health problems run in the family.   REVIEW OF SYSTEMS:  CONSTITUTIONAL:  Denies any generalized weakness.  EYES:  No change in vision.  EARS, NOSE, THROAT:  No change in hearing.  RESPIRATORY:  No cough, shortness of breath.  CARDIOVASCULAR:  No chest pain, palpations.   GASTROINTESTINAL:  Has nausea and melena.  GENITOURINARY:  No dysuria or hematuria.  HEMATOLOGY:  No easy bruising or bleeding.  ENDOCRINE:  No polyuria or polydipsia.  SKIN:  No rash or lesions.  MUSCULOSKELETAL:  No joint pains and aches.  NEUROLOGIC:  No weakness or numbness in any part of the body.   PHYSICAL EXAMINATION: GENERAL:  This is a well-built, well-nourished, age-appropriate male lying down in the bed, not in distress.  VITAL SIGNS:  Temperature 97.9, pulse 108, blood pressure 117/67, respiratory rate of 18, oxygen saturation is 100% on room air.  HEENT:  Head normocephalic, atraumatic.  Eyes, no scleral icterus.  Conjunctivae normal.  Pupils equal and react to light.  Extraocular movements are intact.  Mucous membranes moist.  No pharyngeal erythema. NECK:  Supple.  No lymphadenopathy.  No JVD.  No carotid bruit.  No thyromegaly.  CHEST:  Has no focal tenderness.  LUNGS:  Bilateral clear to auscultation.  HEART:  S1, S2, regular.  No murmurs are heard.  ABDOMEN:  Bowel sounds plus.  Soft, nontender, nondistended.  No hepatosplenomegaly.  EXTREMITIES:  No pedal edema.  Pulses 2+.  SKIN:  No rash or lesions.  MUSCULOSKELETAL:  No joint pains and aches.  Good range of motion in all the extremities.  NEUROLOGIC:  The patient  is alert, oriented to place, person, and time.  Cranial nerves II through XII intact.  Motor 5 by 5 in upper and lower extremities.   LABORATORY DATA:  CBC:  WBC of 7.7, hemoglobin 6.7, platelet count of 136.  CMP:  BUN 31, creatinine of 0.84, ALT 107, AST 101, albumin 2.4.  UA, negative for nitrites and leukocyte esterase.   ASSESSMENT AND PLAN:  David Sheppard is a 49 year old male who comes to the Emergency Department with melena and an episode of syncope, hemoglobin of 6.7.  1.  Gastrointestinal bleed, most likely upper gastrointestinal bleed.  This seems to be a combination of reasons.  The patient takes a high amount of [employer].  The patient also  shows signs of portal hypertension with thrombocytopenia, elevated liver enzymes with low albumin.  Concerning this, we will also admit the patient to the stepdown unit.  Continue with the Protonix and octreotide drip.  Keep the patient nothing by mouth.  Get gastroenterology consult in the morning.  Continue to follow up serial complete blood counts, transfuse 2 units of packed red blood cells, typed and crossmatched.  2.  Anemia secondary to acute gastrointestinal bleed.  The patient has high MCV of 96.  We will obtain iron profile, B12, folate, RBC and also B12 levels.  3.  Elevated liver enzymes.  This could be possibility of shock liver; however, cannot exclude alcohol-induced hepatitis.  4.  Keep the patient on deep vein thrombosis prophylaxis with sequential compression devices.  5.  Syncope:  Most likely secondary to acute blood loss.     TIME SPENT:  55 minutes.    ____________________________ Susa Griffins, MD pv:ea D: 07/29/2013 01:58:52 ET T: 07/29/2013 02:36:36 ET JOB#: [id number]  cc: Susa Griffins, MD, <Dictator> Hannah Beat, MD Susa Griffins MD ELECTRONICALLY SIGNED 08/10/2013 0:41

## 2014-07-06 NOTE — Discharge Summary (Signed)
PATIENT NAME:  David Sheppard, David Sheppard MR#:  [id number] DATE OF BIRTH:  30-Jun-1965  DATE OF ADMISSION:  07/29/2013 DATE OF DISCHARGE:  07/31/2013  ADMITTING PHYSICIAN: Susa Griffins, MD  DISCHARGING PHYSICIAN: Enid Baas, MD  PRIMARY CARE PHYSICIAN: Dr. Karleen Hampshire Copland  CONSULTATIONS IN THE HOSPITAL: GI consultation by Dr. Lutricia Feil.  DISCHARGE DIAGNOSES: 1.  Acute on chronic anemia secondary to gastrointestinal bleed status post 3 unit transfusion.  2.  Gastritis, duodenal ulcers.  3.  Migraine headaches.  4.  Chronic neck pain.   DISCHARGE HOME MEDICATIONS: 1.  Protonix 40 mg p.o. b.i.d.  2.  Sumatriptan 100 mg as needed for migraines.  3.  Celexa 20 mg daily.  4.  Amitriptyline 10 mg at bedtime.  5.  Ferrous sulfate 325 mg 1 tablet daily.   DISCHARGE DIET: Regular diet.   DISCHARGE ACTIVITY: As tolerated.   FOLLOWUP INSTRUCTIONS: PCP follow-up in 2 weeks.   Advised no BC Powder, Motrin or Aleve.  LABS AND IMAGING STUDIES PRIOR TO DISCHARGE: WBC 6.5, hemoglobin 8.5, hematocrit 24.6, platelet count 157,000.   Sodium 143, potassium 3.7, chloride 118, bicarb 25, BUN 8, creatinine 0.87, glucose 104 and calcium 7. INR 1.1. Urinalysis negative for any infection.   BRIEF HOSPITAL COURSE: David Sheppard is a 49 year old male who has migraine headaches and chronic neck pain, takes BC Powder on a regular basis and also drinks alcohol moderately, presented to the hospital secondary to weakness, dizziness and melena and noted to be anemic.   Acute on chronic anemia requiring 3 units of packed RBC transfusion this admission:  Hemoglobin on admission was around 6 and improved up to 8.5 at discharge. The patient is not symptomatic at this time. He did have an EGD done by GI and that showed gastritis, duodenal nonbleeding ulcers. He was on octreotide drip initially with his alcohol history, however, no varices were seen. He was also on Protonix drip and then changed over to p.o. Protonix b.i.d.  at this time. He was strongly advised to stay away from alcohol, BC Powder, and NSAIDs at this time. Outpatient GI followup is recommended. The patient did well and all his other home medications were continued at the time of discharge.   DISCHARGE CONDITION: Stable.  DISCHARGE DISPOSITION: Home.  TIME SPENT ON DISCHARGE: 40 minutes.  ____________________________ Enid Baas, MD rk:sb D: 07/31/2013 13:55:36 ET T: 07/31/2013 14:32:12 ET JOB#: [id number]  cc: Enid Baas, MD, <Dictator> Enid Baas MD ELECTRONICALLY SIGNED 08/09/2013 12:24

## 2014-07-06 NOTE — Consult Note (Signed)
Pt seen and examined. Full consult to  follow. Goody powder daily x 2 yrs for headachs/neck pain. 4-6 beers/night on weekends x min 6 months. Low hgb. Given blood transfusion. Low albumin, elevated AST/ALT, and low PLTC suggests liver cirrhosis already. Order protime tomorrow. Should get liver u/s while patient is here. Will plan EGD tomorrow to check for NSAIDS-induced ulcers. Also, check for portal HTN and esophageal varices. Thanks.  Electronic Signatures: Lutricia Feil (MD)  (Signed on 17-May-15 10:59)  Authored  Last Updated: 17-May-15 10:59 by Lutricia Feil (MD)

## 2014-07-06 NOTE — Consult Note (Signed)
C/O headache now. EGD showed gastritis, duodenitis, and few small duodenal ulcers with clean base. Reg diet ordered. Can stop octreotide drip. Switch to po protonix daily. If no active bleeding, can be discharged tomorrow on protonix. No more goody powder for awhile. Avoid or minimize alcohol intake. Will sign off. thanks.  Electronic Signatures: Lutricia Feil (MD)  (Signed on 18-May-15 14:06)  Authored  Last Updated: 18-May-15 14:06 by Lutricia Feil (MD)

## 2014-07-06 NOTE — Consult Note (Signed)
PATIENT NAME:  David Sheppard, David Sheppard MR#:  [id number] DATE OF BIRTH:  [birth year]  DATE OF CONSULTATION:  07/29/2013  REFERRING PHYSICIAN:   CONSULTING PHYSICIAN:  Ezzard Standing. Jase Himmelberger, MD  REASON FOR REFERRAL: Melena, anemia.   HISTORY OF PRESENT ILLNESS: The patient is a 49 year old white male with a history of migraine headaches and chronic neck pain who has been using Goody's Powder at least 2 to 3 times a day for the past 2 years or so. He also admits to drinking mostly beer, 4 to 6 cans per day over the weekend for minimum of 6 months. He came to the Emergency Room after having an episode of syncope. He has been feeling a little nauseous for the last few days. Two days ago he had an episode of vomiting after he ate. Then he noticed having melena for the last 2 days. After he came back from a cruise, he had a quick episode of syncope. As a result the patient came to the Emergency Room for evaluation. In the Emergency Room, the patient was found to be significantly anemic with a hemoglobin of only 6.7 and MCV was 96. Currently he denies any abdominal pain but does have some nausea.   PAST MEDICAL HISTORY: Notable for chronic neck pain and migraine headaches for which he takes Goody's Powder on a daily basis.   PAST SURGICAL HISTORY: Notable for hernia repair.   ALLERGIES: He has no known drug allergies.   HOME MEDICATIONS: Include sumatriptan 100 mg daily as needed, citalopram 20 mg daily and amitriptyline 10 mg at bedtime.   SOCIAL HISTORY: He uses vaporized cigarettes and drinks 4 to 6 beers per day on weekends.   FAMILY HISTORY: Negative.   REVIEW OF SYSTEMS: Please refer to the initial review of systems dictated on the day of admission. There are no changes.   PHYSICAL EXAMINATION: GENERAL: The patient appears to be in no acute distress.  VITAL SIGNS: He is afebrile. His vital signs are stable.  HEENT: Normocephalic, atraumatic head. Pupils are equally reactive. Throat was clear.  NECK: Supple.   CARDIAC: Regular rhythm and rate without murmurs.  LUNGS: Clear bilaterally.  ABDOMEN: Normoactive bowel sounds, soft and nontender. It is not distended. There was no hepatomegaly. He has active bowel sounds.  EXTREMITIES: No clubbing, cyanosis, or edema.  SKIN: Negative.  NEUROLOGIC: Examination nonfocal.   DIAGNOSTIC DATA: Hemoglobin 6.7 and platelet count was 130. Liver enzymes are abnormal with AST of 101 and ALT of 107. Bilirubin is 0.1. Albumin is low at 2.4. Sodium 139, potassium 3.8, chloride 111, BUN 31, and creatinine 0.84. Urinalysis is negative.   ASSESSMENT AND PLAN: This is a patient with significant anemia and melena. 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 He may very well have either alcohol abuse or Goody's Powder-induced ulcers in the stomach or the duodenal area. He also has some abnormal liver enzymes with a low albumin and low platelet count suggesting chronic liver disease. He may even have liver cirrhosis already. I recommend that we order a pro time tomorrow. We should get a liver ultrasound while the patient is here. He needs to avoid alcohol and NSAIDs. We will plan for gastroscopy tomorrow to check for NSAID-induced ulcers. Because of the possibility of chronic liver disease, will check for any evidence of portal hypertension and esophageal varices.   Thank you for the referral. ____________________________ Ezzard Standing. Bluford Kaufmann, MD pyo:sb D: 07/30/2013 16:05:00 ET T: 07/30/2013 16:29:52 ET JOB#: [id number]  cc: Renae Fickle  Guadelupe Sabin, MD, <Dictator> Ezzard Standing Amberlie Gaillard MD ELECTRONICALLY SIGNED 08/01/2013 9:20

## 2014-11-20 ENCOUNTER — Ambulatory Visit (INDEPENDENT_AMBULATORY_CARE_PROVIDER_SITE_OTHER): Payer: BLUE CROSS/BLUE SHIELD | Admitting: Podiatry

## 2014-11-20 ENCOUNTER — Encounter: Payer: Self-pay | Admitting: Podiatry

## 2014-11-20 ENCOUNTER — Ambulatory Visit (INDEPENDENT_AMBULATORY_CARE_PROVIDER_SITE_OTHER): Payer: BLUE CROSS/BLUE SHIELD

## 2014-11-20 VITALS — BP 113/75 | HR 93 | Resp 16 | Ht 69.0 in | Wt 175.0 lb

## 2014-11-20 DIAGNOSIS — M2011 Hallux valgus (acquired), right foot: Secondary | ICD-10-CM

## 2014-11-20 NOTE — Progress Notes (Signed)
   Subjective:    Patient ID: David Sheppard, male    DOB: [birth year], 49 y.o.   MRN: [id number]  HPI: He presents today with a chief complaint of a painful bunion to his right foot. States that it bothers him particularly with shoe gear as he has to wear still toes at work. He states this seems to be getting worse and is limiting his ability to perform his daily activities regularly. He denies any trauma.    Review of Systems  All other systems reviewed and are negative.      Objective:   Physical Exam: 49 year old male history of painful bunion right foot. Vital signs are stable alert and oriented 3 pulses are strongly palpable. Neurologic sensorium is intact for Semmes-Weinstein monofilament. Deep tendon reflexes are intact. Muscle strength +5 over 5 dorsiflexion plantar flexors and inverters everters onto the musculature is intact. Orthopedic evaluation demonstrates hallux abductovalgus deformity bilateral right greater than left. Limitation in range of motion to approximate 45 of dorsiflexion. He appears to be tracking but not yet track bound. Radiographic evaluation demonstrates 3 views bilateral foot with an increase in the first intermetatarsal angle greater than normal values. Hallux adductus angle greater than normal values. Cutaneous evaluation and straight supple well-hydrated cutis no erythema edema cellulitis drainage or odor.        Assessment & Plan:  Assessment: Painful hallux abductovalgus deformity right foot.  Plan: We discussed the etiology pathology conservative versus surgical therapies. He did not want to sign a consent form today for surgery. He would like to discuss this with his family as well as human resources at work. He will notify us when he is ready for surgical consult.

## 2014-11-21 ENCOUNTER — Telehealth: Payer: Self-pay | Admitting: *Deleted

## 2014-11-21 NOTE — Telephone Encounter (Signed)
Pt states he saw Dr. Al Corpus 11/19/2104 and surgery was recommended, but he is not ready for surgery at this time, but would like to know how this would affect his foot if he waited.  Pt was seen yesterday for a painful bunion.  I spoke with pt and told him that depending on the severity of his deformity and pain he was experiencing, he could decide to wait, but it could worsen, also his recovery could depend on the severity and the type of surgery chosen due to the severity, but his recovery could also depend on his frame of mind when he chose to have the surgery.  I told him I knew that was a lot to think on but it was his choice.  Pt states he will look at his schedule and call for an appt.

## 2015-01-02 ENCOUNTER — Other Ambulatory Visit: Payer: Self-pay | Admitting: Family Medicine

## 2015-01-09 ENCOUNTER — Telehealth: Payer: Self-pay | Admitting: Family Medicine

## 2015-01-09 ENCOUNTER — Ambulatory Visit: Payer: Self-pay | Admitting: Internal Medicine

## 2015-01-09 NOTE — Telephone Encounter (Signed)
Pt has appt with Nicki Reaper, NP today at 4:00pm

## 2015-01-09 NOTE — Telephone Encounter (Signed)
D/w patient. He has a fractured penis with f/u in am at 10:30 with [facility]

## 2015-01-09 NOTE — Telephone Encounter (Signed)
Pt was seen at [facility] ed in [place], was told to get urology appt asap. The ed there was going to set him up but he told them he would go home and do it.  Pt is in route back to [place] now.   Please call [phone] Thank you

## 2015-01-09 NOTE — Telephone Encounter (Addendum)
Spoke with [name]. He is not going to make the 4:00 with Nicki Reaper.  He is still driving back from [place].  Needs Urology Referral.  He has records of his ER visit in [place].

## 2015-01-09 NOTE — Telephone Encounter (Signed)
i need to know his diagnosis.

## 2015-01-09 NOTE — Telephone Encounter (Signed)
He was seen in the ER in [place].  He has those records.  They recommended a Urology Consult but he wanted to wait until he got home and see someone locally.  Do you want me to tell him to go back to the ER here??

## 2015-01-09 NOTE — Telephone Encounter (Signed)
Pt called to follow up on response from urology referral. Please call [phone] thnk you

## 2015-01-09 NOTE — Telephone Encounter (Signed)
It sounds as if he needs urgent urological care.   Earlier I advised him to go to ER. If you need assistance, I am available this afternoon.

## 2015-01-09 NOTE — Telephone Encounter (Signed)
[facility] - Client TELEPHONE ADVICE RECORD [facility] Patient Name: David Sheppard DOB: Aug 10, 1965 Initial Comment Caller states has ruptured something in his penis, very swollen and black, pain in testicles Nurse Assessment Nurse: Lane Hacker, RN, Elvin So Date/Time (Eastern Time): 01/09/2015 9:10:17 AM Confirm and document reason for call. If symptomatic, describe symptoms. ---Caller states that he feels like he ruptured something in his penis last night while having intercourse. The whole penis is very swollen, painful, and bruised/dark. Also pain in testicles. Denies injury. Has the patient traveled out of the country within the last 30 days? ---Not Applicable Does the patient have any new or worsening symptoms? ---Yes Will a triage be completed? ---Yes Related visit to physician within the last 2 weeks? ---No Does the PT have any chronic conditions? (i.e. diabetes, asthma, etc.) ---No Guidelines Guideline Title Affirmed Question Affirmed Notes Penis and Scrotum Symptoms Entire penis is swollen (i.e., edema) --->RN upgraded to go now to ER since this has been all night and more than 4 hours has easily passed. Final Disposition User Go to ED Now Lane Hacker, RN, Windy Referrals GO TO FACILITY OTHER - SPECIFY Disagree/Comply: Comply

## 2015-01-09 NOTE — Telephone Encounter (Signed)
Attempted to call. No answer.

## 2015-01-09 NOTE — Telephone Encounter (Signed)
Possible urological emergency - needs to go to ER NOW

## 2015-01-09 NOTE — Telephone Encounter (Signed)
No answer at home number.  Not able to leave a messaage.  Called cell phone and got voicemail.. Left message for Verle that Dr. Patsy Lager would like him to go to [facility] ER to be evaluated now and not wait until 4:00 pm appointment with Nicki Reaper.

## 2015-01-09 NOTE — Telephone Encounter (Signed)
Previously recommended urgent ER evaluation. I was under the impression he would be coming to the office to see our NP emergently. We have no records.   I am unclear what has happened to the patient.   The patient now has called requesting urology appointment - none of this has followed my medical advice.   I again recommend urgent urological evaluation - best facilitated in the emergency room.  Electronically Signed  By: Hannah Beat, MD On: 01/09/2015 5:01 PM

## 2015-01-10 ENCOUNTER — Ambulatory Visit (INDEPENDENT_AMBULATORY_CARE_PROVIDER_SITE_OTHER): Payer: BLUE CROSS/BLUE SHIELD | Admitting: Obstetrics and Gynecology

## 2015-01-10 ENCOUNTER — Encounter: Payer: Self-pay | Admitting: Obstetrics and Gynecology

## 2015-01-10 VITALS — BP 105/72 | HR 84 | Resp 18 | Ht 69.0 in | Wt 177.4 lb

## 2015-01-10 DIAGNOSIS — S39840A Fracture of corpus cavernosum penis, initial encounter: Secondary | ICD-10-CM

## 2015-01-10 LAB — MICROSCOPIC EXAMINATION
Epithelial Cells (non renal): NONE SEEN /hpf (ref 0–10)
Renal Epithel, UA: NONE SEEN /hpf
WBC UA: NONE SEEN /HPF (ref 0–?)

## 2015-01-10 LAB — URINALYSIS, COMPLETE
BILIRUBIN UA: NEGATIVE
GLUCOSE, UA: NEGATIVE
KETONES UA: NEGATIVE
Leukocytes, UA: NEGATIVE
NITRITE UA: NEGATIVE
PROTEIN UA: NEGATIVE
SPEC GRAV UA: 1.02 (ref 1.005–1.030)
UUROB: 0.2 mg/dL (ref 0.2–1.0)
pH, UA: 7 (ref 5.0–7.5)

## 2015-01-10 NOTE — Progress Notes (Signed)
01/10/2015 3:10 PM   David Sheppard 1965/04/25 [id number]  Referring provider: Hannah Beat, MD [address] [place] [address] [place], [place] [address]  Chief Complaint  Patient presents with  . Establish Care  . Testicle Pain  . Penis Pain    HPI: Patient is a 49yo male presenting today for follow up after being seen in an ED in [facility] yesterday. He reports that he and his wife were having intercourse on 01/08/15 at approximately 11pm when his wife felt a "pop" in his penis. He did not immediately lose his He was seen in the ED by a urologist who recommended surgery.  Patient and wife stated that they decided to return home and be seen here.  No previous history of urologic surgeries.  He does not take anticoagulants.  He denies cardiac or pulmonary issues.   PMH: Past Medical History  Diagnosis Date  . Allergy   . GERD (gastroesophageal reflux disease)   . Migraine   . Generalized anxiety disorder 07/30/2011  . Panic attacks 07/30/2011    Surgical History: Past Surgical History  Procedure Laterality Date  . Hernia repair    . Tonsillectomy and adenoidectomy      Home Medications:    Medication List       This list is accurate as of: 01/10/15  3:10 PM.  Always use your most recent med list.               citalopram 20 MG tablet  Commonly known as:  CELEXA  TAKE 1 TABLET BY MOUTH DAILY     pantoprazole 40 MG tablet  Commonly known as:  PROTONIX  TAKE 1 TABLET BY MOUTH DAILY     SUMAtriptan 100 MG tablet  Commonly known as:  IMITREX  TAKE ONE TABLET AS NEEDED FOR MIGRAINE. MAY REPEAT IN TWO HOURS IF NEEDED *MAX OF 2 TABLETS IN 24 HOURS*        Allergies: No Known Allergies  Family History: Family History  Problem Relation Age of Onset  . Hypertension Father   . Hyperlipidemia Paternal Grandmother   . Diabetes Paternal Grandmother   . Heart disease Paternal Grandfather   . Hyperlipidemia Paternal Grandfather   . Diabetes  Paternal Grandfather     Social History:  reports that he has quit smoking. He has quit using smokeless tobacco. He reports that he drinks alcohol. He reports that he does not use illicit drugs.  ROS: UROLOGY Frequent Urination?: No Hard to postpone urination?: No Burning/pain with urination?: No Get up at night to urinate?: No Leakage of urine?: No Urine stream starts and stops?: No Trouble starting stream?: No Do you have to strain to urinate?: No Blood in urine?: No Urinary tract infection?: No Sexually transmitted disease?: No Injury to kidneys or bladder?: No Painful intercourse?: No Weak stream?: No Erection problems?: No Penile pain?: Yes  Gastrointestinal Nausea?: No Vomiting?: No Indigestion/heartburn?: No Diarrhea?: No Constipation?: No  Constitutional Fever: No Night sweats?: No Weight loss?: No Fatigue?: No  Skin Skin rash/lesions?: No Itching?: No  Eyes Blurred vision?: No Double vision?: No  Ears/Nose/Throat Sore throat?: No Sinus problems?: No  Hematologic/Lymphatic Swollen glands?: No Easy bruising?: No  Cardiovascular Leg swelling?: No Chest pain?: No  Respiratory Cough?: No Shortness of breath?: No  Endocrine Excessive thirst?: No  Musculoskeletal Back pain?: No Joint pain?: No  Neurological Headaches?: Yes Dizziness?: No  Psychologic Depression?: No Anxiety?: Yes  Physical Exam: BP 105/72 mmHg  Pulse  84  Resp 18  Ht 5\' 9"  (1.753 m)  Wt 177 lb 6.4 oz (80.468 kg)  BMI 26.19 kg/m2  Constitutional:  Alert and oriented, No acute distress. HEENT: [place] AT, moist mucus membranes.  Trachea midline, no masses. Cardiovascular: No clubbing, cyanosis, or edema. Respiratory: Normal respiratory effort, no increased work of breathing, CTAB GI: Abdomen is soft, nontender, nondistended, no abdominal masses GU: No CVA tenderness. Penis swollen L>R with ecchymosis extending to the upper portion of scrotum,  Non tender, normal  scrotum Skin: No rashes, bruises or suspicious lesions. Lymph: No cervical or inguinal adenopathy. Neurologic: Grossly intact, no focal deficits, moving all 4 extremities. Psychiatric: Normal mood and affect.  Laboratory Data:   Urinalysis    Component Value Date/Time   COLORURINE Straw 07/29/2013 0027   APPEARANCEUR Clear 07/29/2013 0027   LABSPEC 1.017 07/29/2013 0027   PHURINE 6.0 07/29/2013 0027   GLUCOSEU Negative 01/10/2015 1108   GLUCOSEU Negative 07/29/2013 0027   HGBUR Negative 07/29/2013 0027   BILIRUBINUR Negative 01/10/2015 1108   BILIRUBINUR Negative 07/29/2013 0027   KETONESUR Negative 07/29/2013 0027   PROTEINUR Negative 07/29/2013 0027   NITRITE Negative 01/10/2015 1108   NITRITE Negative 07/29/2013 0027   LEUKOCYTESUR Negative 01/10/2015 1108   LEUKOCYTESUR Negative 07/29/2013 0027    Pertinent Imaging:   Assessment & Plan:   1. Penile Fracture-  Patient was also seen and assessed by Dr. 07/31/2013. Assessment and recommendations discussed an appointment. She will follow-up in 4 weeks. - Urinalysis, Complete   Return in about 4 weeks (around 02/07/2015).  These notes generated with voice recognition software. I apologize for typographical errors.  02/09/2015, FNP  [facility] [address], [address] [place], [place] [id number]

## 2015-01-10 NOTE — Progress Notes (Signed)
I was asked to assess the patient with a working diagnosis of a penile fracture more than 36 hours prior. The local urologist had recommended surgery but the patient wanted to be treated locally.  His wife heard a pop but there was no detumescence.  He's having no pain and the swelling is less today than it was yesterday. He does not know if these had a nighttime erection thus far  On physical examination he was in no distress. He had mild and arguably mild to moderate swelling of the penile shaft with purple bruising. He minimal edema at 6:00 of the penile skin below the glans penis. There is no cellulitis. There was absolutely no tenderness. Scrotum was normal  I spoke to Dr. Annabell Howells and we both agreed on the following plan:  I recommend a watchful waiting and reviewed the surgical versus nonsurgical decision process with pros and cons and risks described. I truly felt that surgery was not in his best interest and described this twice to him. He will abstain from intercourse for at least 4 weeks. I mentioned antibiotics and did not think he needed any. If things change over the weekend tonight or in the future he will call us immediately. We will check on him in a month before re-prescribing sexual activity

## 2015-01-16 ENCOUNTER — Telehealth: Payer: Self-pay | Admitting: Radiology

## 2015-01-16 NOTE — Telephone Encounter (Signed)
Notified pt that per Dr Sherryl Barters he should refrain from intercourse and keep his appt with Lillia Abed on 11/28. Pt voices understanding.

## 2015-01-16 NOTE — Telephone Encounter (Signed)
Pt was seen by Lillia Abed on 10/28 for penile fracture. He states he seems to be healing but has a knot/swollen area on his penis. He has a f/u appt with Lillia Abed on 02/10/15 but asks if he needs to be concerned. Please advise.

## 2015-01-22 ENCOUNTER — Other Ambulatory Visit (INDEPENDENT_AMBULATORY_CARE_PROVIDER_SITE_OTHER): Payer: BLUE CROSS/BLUE SHIELD

## 2015-01-22 DIAGNOSIS — Z79899 Other long term (current) drug therapy: Secondary | ICD-10-CM | POA: Diagnosis not present

## 2015-01-22 DIAGNOSIS — Z125 Encounter for screening for malignant neoplasm of prostate: Secondary | ICD-10-CM

## 2015-01-22 DIAGNOSIS — E785 Hyperlipidemia, unspecified: Secondary | ICD-10-CM

## 2015-01-22 LAB — CBC WITH DIFFERENTIAL/PLATELET
BASOS ABS: 0.1 10*3/uL (ref 0.0–0.1)
Basophils Relative: 0.9 % (ref 0.0–3.0)
Eosinophils Absolute: 0.4 10*3/uL (ref 0.0–0.7)
Eosinophils Relative: 6.5 % — ABNORMAL HIGH (ref 0.0–5.0)
HCT: 45.4 % (ref 39.0–52.0)
Hemoglobin: 15.4 g/dL (ref 13.0–17.0)
LYMPHS ABS: 1.8 10*3/uL (ref 0.7–4.0)
Lymphocytes Relative: 26.9 % (ref 12.0–46.0)
MCHC: 33.9 g/dL (ref 30.0–36.0)
MCV: 93.9 fl (ref 78.0–100.0)
MONO ABS: 0.5 10*3/uL (ref 0.1–1.0)
Monocytes Relative: 7.8 % (ref 3.0–12.0)
NEUTROS PCT: 57.9 % (ref 43.0–77.0)
Neutro Abs: 3.8 10*3/uL (ref 1.4–7.7)
Platelets: 205 10*3/uL (ref 150.0–400.0)
RBC: 4.83 Mil/uL (ref 4.22–5.81)
RDW: 12.8 % (ref 11.5–15.5)
WBC: 6.6 10*3/uL (ref 4.0–10.5)

## 2015-01-22 LAB — LIPID PANEL
CHOL/HDL RATIO: 5
Cholesterol: 206 mg/dL — ABNORMAL HIGH (ref 0–200)
HDL: 45.4 mg/dL (ref >39.00)
LDL Cholesterol: 139 mg/dL — ABNORMAL HIGH (ref 0–99)
NONHDL: 160.69
Triglycerides: 107 mg/dL (ref 0.0–149.0)
VLDL: 21.4 mg/dL (ref 0.0–40.0)

## 2015-01-22 LAB — HEPATIC FUNCTION PANEL
ALK PHOS: 104 U/L (ref 39–117)
ALT: 33 U/L (ref 0–53)
AST: 18 U/L (ref 0–37)
Albumin: 4.4 g/dL (ref 3.5–5.2)
BILIRUBIN DIRECT: 0.1 mg/dL (ref 0.0–0.3)
BILIRUBIN TOTAL: 0.6 mg/dL (ref 0.2–1.2)
TOTAL PROTEIN: 6.9 g/dL (ref 6.0–8.3)

## 2015-01-22 LAB — BASIC METABOLIC PANEL
BUN: 10 mg/dL (ref 6–23)
CALCIUM: 9.5 mg/dL (ref 8.4–10.5)
CHLORIDE: 105 meq/L (ref 96–112)
CO2: 30 meq/L (ref 19–32)
Creatinine, Ser: 0.83 mg/dL (ref 0.40–1.50)
GFR: 105.38 mL/min (ref >60.00)
Glucose, Bld: 89 mg/dL (ref 70–99)
Potassium: 4.2 mEq/L (ref 3.5–5.1)
Sodium: 142 mEq/L (ref 135–145)

## 2015-01-22 LAB — PSA: PSA: 0.42 ng/mL (ref 0.10–4.00)

## 2015-01-27 ENCOUNTER — Encounter: Payer: Self-pay | Admitting: Family Medicine

## 2015-01-27 ENCOUNTER — Ambulatory Visit (INDEPENDENT_AMBULATORY_CARE_PROVIDER_SITE_OTHER): Payer: BLUE CROSS/BLUE SHIELD | Admitting: Family Medicine

## 2015-01-27 VITALS — BP 106/74 | HR 87 | Temp 98.4°F | Ht 68.0 in | Wt 177.0 lb

## 2015-01-27 DIAGNOSIS — Z0000 Encounter for general adult medical examination without abnormal findings: Secondary | ICD-10-CM | POA: Diagnosis not present

## 2015-01-27 NOTE — Progress Notes (Signed)
Pre visit review using our clinic review tool, if applicable. No additional management support is needed unless otherwise documented below in the visit note. 

## 2015-01-27 NOTE — Progress Notes (Signed)
Dr. Frederico Hamman T. Kwane Rohl, MD, [facility] [place], [address] Phone: [phone] Fax: [phone]  01/27/2015  Patient: David Sheppard, MRN: [id number], DOB: [birth year], 49 y.o.  Primary Physician:  Owens Loffler, MD   Chief Complaint  Patient presents with  . Annual Exam   Subjective:   David Sheppard is a 49 y.o. pleasant patient who presents with the following:  Preventative Health Maintenance Visit:  Health Maintenance Summary Reviewed and updated, unless pt declines services.  Tobacco History Reviewed. Restarted Alcohol: No concerns, no excessive use Exercise Habits: Some activity, rec at least 30 mins 5 times a week STD concerns: no risk or activity to increase risk Drug Use: None Encouraged self-testicular check  Health Maintenance  Topic Date Due  . HIV Screening  09/05/1982  . INFLUENZA VACCINE  06/13/2015 (Originally 10/14/2014)  . TETANUS/TDAP  06/04/2018   Immunization History  Administered Date(s) Administered  . Td 06/03/2008   Patient Active Problem List   Diagnosis Date Noted  . Generalized anxiety disorder 07/30/2011  . Panic attacks 07/30/2011  . OTHER MALAISE AND FATIGUE 06/24/2008  . GERD 06/21/2008  . MIGRAINE HEADACHE 06/04/2008  . ALLERGIC RHINITIS 06/04/2008   Past Medical History  Diagnosis Date  . Allergy   . GERD (gastroesophageal reflux disease)   . Migraine   . Generalized anxiety disorder 07/30/2011  . Panic attacks 07/30/2011   Past Surgical History  Procedure Laterality Date  . Hernia repair    . Tonsillectomy and adenoidectomy     Social History   Social History  . Marital Status: Married    Spouse Name: N/A  . Number of Children: 2  . Years of Education: N/A   Occupational History  . injection mold setup machinery     Social History Main Topics  . Smoking status: Current Every Day Smoker -- 0.50 packs/day    Types: Cigarettes  . Smokeless  tobacco: Former Systems developer     Comment: quit 2009  . Alcohol Use: 0.0 oz/week    0 Standard drinks or equivalent per week     Comment: occ  . Drug Use: No  . Sexual Activity: Not on file   Other Topics Concern  . Not on file   Social History Narrative   1 daily caffiene use   Regular exercise-walking         Family History  Problem Relation Age of Onset  . Hypertension Father   . Hyperlipidemia Paternal Grandmother   . Diabetes Paternal Grandmother   . Heart disease Paternal Grandfather   . Hyperlipidemia Paternal Grandfather   . Diabetes Paternal Grandfather    No Known Allergies  Medication list has been reviewed and updated.   General: Denies fever, chills, sweats. No significant weight loss. Eyes: Denies blurring,significant itching ENT: Denies earache, sore throat, and hoarseness. Cardiovascular: Denies chest pains, palpitations, dyspnea on exertion Respiratory: Denies cough, dyspnea at rest,wheeezing Breast: no concerns about lumps GI: Denies nausea, vomiting, diarrhea, constipation, change in bowel habits, abdominal pain, melena, hematochezia GU: Denies penile discharge, ED, urinary flow / outflow problems. No STD concerns. Musculoskeletal: Denies back pain, joint pain Derm: Denies rash, itching Neuro: Denies  paresthesias, frequent falls, frequent headaches Psych: Denies depression, anxiety Endocrine: Denies cold intolerance, heat intolerance, polydipsia Heme: Denies enlarged lymph nodes Allergy: No hayfever  Objective:   BP 106/74 mmHg  Pulse 87  Temp(Src) 98.4 F (36.9 C) (Oral)  Ht _0  (1.727 m)  Wt 177 lb (80.287 kg)  BMI 26.92 kg/m2 Ideal Body Weight: Weight in (lb) to have BMI = 25: 164.1  No exam data present  GEN: well developed, well nourished, no acute distress Eyes: conjunctiva and lids normal, PERRLA, EOMI ENT: TM clear, nares clear, oral exam WNL Neck: supple, no lymphadenopathy, no thyromegaly, no JVD Pulm: clear to auscultation and  percussion, respiratory effort normal CV: regular rate and rhythm, S1-S2, no murmur, rub or gallop, no bruits, peripheral pulses normal and symmetric, no cyanosis, clubbing, edema or varicosities GI: soft, non-tender; no hepatosplenomegaly, masses; active bowel sounds all quadrants GU: no hernia, testicular mass, penile discharge Lymph: no cervical, axillary or inguinal adenopathy MSK: gait normal, muscle tone and strength WNL, no joint swelling, effusions, discoloration, crepitus  SKIN: clear, good turgor, color WNL, no rashes, lesions, or ulcerations Neuro: normal mental status, normal strength, sensation, and motion Psych: alert; oriented to person, place and time, normally interactive and not anxious or depressed in appearance. All labs reviewed with patient.  Lipids:    Component Value Date/Time   CHOL 206* 01/22/2015 0946   TRIG 107.0 01/22/2015 0946   HDL 45.40 01/22/2015 0946   LDLDIRECT 121.9 07/29/2011 1645   VLDL 21.4 01/22/2015 0946   CHOLHDL 5 01/22/2015 0946   CBC: CBC Latest Ref Rng 01/22/2015 12/26/2013 07/31/2013  WBC 4.0 - 10.5 K/uL 6.6 5.8 6.5  Hemoglobin 13.0 - 17.0 g/dL 15.4 14.8 8.5(L)  Hematocrit 39.0 - 52.0 % 45.4 43.9 24.6(L)  Platelets 150.0 - 400.0 K/uL 205.0 183.0 676    Basic Metabolic Panel:    Component Value Date/Time   NA 142 01/22/2015 0946   NA 143 07/30/2013 0348   K 4.2 01/22/2015 0946   K 3.7 07/30/2013 0348   CL 105 01/22/2015 0946   CL 118* 07/30/2013 0348   CO2 30 01/22/2015 0946   CO2 25 07/30/2013 0348   BUN 10 01/22/2015 0946   BUN 8 07/30/2013 0348   CREATININE 0.83 01/22/2015 0946   CREATININE 0.87 07/30/2013 0348   GLUCOSE 89 01/22/2015 0946   GLUCOSE 104* 07/30/2013 0348   CALCIUM 9.5 01/22/2015 0946   CALCIUM 7.0* 07/30/2013 0348   Hepatic Function Latest Ref Rng 01/22/2015 12/26/2013 07/28/2013  Total Protein 6.0 - 8.3 g/dL 6.9 7.6 4.7(L)  Albumin 3.5 - 5.2 g/dL 4.4 3.6 2.4(L)  AST 0 - 37 U/L 18 36 101(H)  ALT 0 - 53  U/L 33 55(H) 107(H)  Alk Phosphatase 39 - 117 U/L 104 98 85  Total Bilirubin 0.2 - 1.2 mg/dL 0.6 0.8 0.1(L)  Bilirubin, Direct 0.0 - 0.3 mg/dL 0.1 0.1 -    Lab Results  Component Value Date   TSH 1.02 07/29/2011   Lab Results  Component Value Date   PSA 0.42 01/22/2015    Assessment and Plan:   Healthcare maintenance  Health Maintenance Exam: The patient's preventative maintenance and recommended screening tests for an annual wellness exam were reviewed in full today. Brought up to date unless services declined.  Counselled on the importance of diet, exercise, and its role in overall health and mortality. The patient's FH and SH was reviewed, including their home life, tobacco status, and drug and alcohol status.  Quit smoking and work on weight and exercise  Follow-up: No Follow-up on file. Unless noted, follow-up in 1 year for Health Maintenance Exam.  New Prescriptions   No medications on file   Modified Medications   No medications on file   No orders of the  defined types were placed in this encounter.    Signed,  Maud Deed. Naila Elizondo, MD   Patient's Medications  New Prescriptions   No medications on file  Previous Medications   CITALOPRAM (CELEXA) 20 MG TABLET    TAKE 1 TABLET BY MOUTH DAILY   PANTOPRAZOLE (PROTONIX) 40 MG TABLET    TAKE 1 TABLET BY MOUTH DAILY   SUMATRIPTAN (IMITREX) 100 MG TABLET    TAKE ONE TABLET AS NEEDED FOR MIGRAINE. MAY REPEAT IN TWO HOURS IF NEEDED *MAX OF 2 TABLETS IN 24 HOURS*  Modified Medications   No medications on file  Discontinued Medications   No medications on file

## 2015-02-04 ENCOUNTER — Other Ambulatory Visit: Payer: Self-pay | Admitting: Family Medicine

## 2015-02-10 ENCOUNTER — Ambulatory Visit: Payer: BLUE CROSS/BLUE SHIELD | Admitting: Obstetrics and Gynecology

## 2015-07-18 ENCOUNTER — Emergency Department
Admission: EM | Admit: 2015-07-18 | Discharge: 2015-07-18 | Disposition: A | Discharge status: Home / Self Care | Payer: BLUE CROSS/BLUE SHIELD | Attending: Emergency Medicine | Admitting: Emergency Medicine

## 2015-07-18 ENCOUNTER — Emergency Department: Payer: BLUE CROSS/BLUE SHIELD

## 2015-07-18 DIAGNOSIS — X58XXXA Exposure to other specified factors, initial encounter: Secondary | ICD-10-CM | POA: Diagnosis not present

## 2015-07-18 DIAGNOSIS — Y939 Activity, unspecified: Secondary | ICD-10-CM | POA: Insufficient documentation

## 2015-07-18 DIAGNOSIS — Y999 Unspecified external cause status: Secondary | ICD-10-CM | POA: Insufficient documentation

## 2015-07-18 DIAGNOSIS — S6991XA Unspecified injury of right wrist, hand and finger(s), initial encounter: Secondary | ICD-10-CM

## 2015-07-18 DIAGNOSIS — Y929 Unspecified place or not applicable: Secondary | ICD-10-CM | POA: Diagnosis not present

## 2015-07-18 DIAGNOSIS — F17210 Nicotine dependence, cigarettes, uncomplicated: Secondary | ICD-10-CM | POA: Insufficient documentation

## 2015-07-18 MED ORDER — MELOXICAM 15 MG PO TABS: 15.0000 mg | ORAL_TABLET | Freq: Every day | ORAL | Status: DC

## 2015-07-18 NOTE — Discharge Instructions (Signed)
Finger Sprain A finger sprain is a tear in one of the strong, fibrous tissues that connect the bones (ligaments) in your finger. The severity of the sprain depends on how much of the ligament is torn. The tear can be either partial or complete. CAUSES  Often, sprains are a result of a fall or accident. If you extend your hands to catch an object or to protect yourself, the force of the impact causes the fibers of your ligament to stretch too much. This excess tension causes the fibers of your ligament to tear. SYMPTOMS  You may have some loss of motion in your finger. Other symptoms include:  Bruising.  Tenderness.  Swelling. DIAGNOSIS  In order to diagnose finger sprain, your caregiver will physically examine your finger or thumb to determine how torn the ligament is. Your caregiver may also suggest an X-ray exam of your finger to make sure no bones are broken. TREATMENT  If your ligament is only partially torn, treatment usually involves keeping the finger in a fixed position (immobilization) for a short period. To do this, your caregiver will apply a bandage, cast, or splint to keep your finger from moving until it heals. For a partially torn ligament, the healing process usually takes 2 to 3 weeks. If your ligament is completely torn, you may need surgery to reconnect the ligament to the bone. After surgery a cast or splint will be applied and will need to stay on your finger or thumb for 4 to 6 weeks while your ligament heals. HOME CARE INSTRUCTIONS  Keep your injured finger elevated, when possible, to decrease swelling.  To ease pain and swelling, apply ice to your joint twice a day, for 2 to 3 days:  Put ice in a plastic bag.  Place a towel between your skin and the bag.  Leave the ice on for 15 minutes.  Only take over-the-counter or prescription medicine for pain as directed by your caregiver.  Do not wear rings on your injured finger.  Do not leave your finger unprotected  until pain and stiffness go away (usually 3 to 4 weeks).  Do not allow your cast or splint to get wet. Cover your cast or splint with a plastic bag when you shower or bathe. Do not swim.  Your caregiver may suggest special exercises for you to do during your recovery to prevent or limit permanent stiffness. SEEK IMMEDIATE MEDICAL CARE IF:  Your cast or splint becomes damaged.  Your pain becomes worse rather than better. MAKE SURE YOU:  Understand these instructions.  Will watch your condition.  Will get help right away if you are not doing well or get worse.   This information is not intended to replace advice given to you by your health care provider. Make sure you discuss any questions you have with your health care provider.   Document Released: 04/08/2004 Document Revised: 03/22/2014 Document Reviewed: 11/02/2010 Elsevier Interactive Patient Education 2016 [employer] Inc[employer]  

## 2015-07-18 NOTE — ED Provider Notes (Signed)
[facility] Emergency Department Provider Note  ____________________________________________  Time seen: Approximately 9:59 PM  I have reviewed the triage vital signs and the nursing notes.   HISTORY  Chief Complaint Finger Injury    HPI David Sheppard is a 50 y.o. male who presents emergency Department with a right middle finger injury. Patient states that he went to somebody and it popped out of joint. Patient states that he is still able to move the finger appropriately. He denies numbness or tingling distal to that. Patient denies any previous injuries to the hand or finger. Patient is not taking medications prior to arrival. Patient does not have any pain at this time.   Past Medical History  Diagnosis Date  . Allergy   . GERD (gastroesophageal reflux disease)   . Migraine   . Generalized anxiety disorder 07/30/2011  . Panic attacks 07/30/2011    Patient Active Problem List   Diagnosis Date Noted  . Generalized anxiety disorder 07/30/2011  . Panic attacks 07/30/2011  . OTHER MALAISE AND FATIGUE 06/24/2008  . GERD 06/21/2008  . MIGRAINE HEADACHE 06/04/2008  . ALLERGIC RHINITIS 06/04/2008    Past Surgical History  Procedure Laterality Date  . Hernia repair    . Tonsillectomy and adenoidectomy      Current Outpatient Rx  Name  Route  Sig  Dispense  Refill  . citalopram (CELEXA) 20 MG tablet      TAKE 1 TABLET BY MOUTH DAILY   30 tablet   5   . meloxicam (MOBIC) 15 MG tablet   Oral   Take 1 tablet (15 mg total) by mouth daily.   30 tablet   0   . pantoprazole (PROTONIX) 40 MG tablet      TAKE 1 TABLET BY MOUTH DAILY   30 tablet   5   . SUMAtriptan (IMITREX) 100 MG tablet      TAKE ONE TABLET AS NEEDED FOR MIGRAINE. MAY REPEAT IN TWO HOURS IF NEEDED *MAX OF 2 TABLETS IN 24 HOURS*   9 tablet   5     Allergies Review of patient's allergies indicates no known allergies.  Family History  Problem Relation Age of Onset  .  Hypertension Father   . Hyperlipidemia Paternal Grandmother   . Diabetes Paternal Grandmother   . Heart disease Paternal Grandfather   . Hyperlipidemia Paternal Grandfather   . Diabetes Paternal Grandfather     Social History Social History  Substance Use Topics  . Smoking status: Current Every Day Smoker -- 0.50 packs/day    Types: Cigarettes  . Smokeless tobacco: Former Neurosurgeon     Comment: quit 2009  . Alcohol Use: 0.0 oz/week    0 Standard drinks or equivalent per week     Comment: occ     Review of Systems  Constitutional: No fever/chills Cardiovascular: no chest pain. Respiratory: no cough. No SOB. Musculoskeletal: Positive for injury to the right middle finger. Skin: Negative for rash, abrasions, lacerations, ecchymosis. Neurological: Negative for headaches, focal weakness or numbness. 10-point ROS otherwise negative.  ____________________________________________   PHYSICAL EXAM:  VITAL SIGNS: ED Triage Vitals  Enc Vitals Group     BP 07/18/15 1924 122/78 mmHg     Pulse Rate 07/18/15 1924 93     Resp 07/18/15 1924 20     Temp 07/18/15 1924 98 F (36.7 C)     Temp Source 07/18/15 1924 Oral     SpO2 07/18/15 1924 97 %  Weight 07/18/15 1924 175 lb (79.379 kg)     Height 07/18/15 1924 5\' 9"  (1.753 m)     Head Cir --      Peak Flow --      Pain Score 07/18/15 1926 4     Pain Loc --      Pain Edu? --      Excl. in GC? --      Constitutional: Alert and oriented. Well appearing and in no acute distress. Eyes: Conjunctivae are normal. PERRL. EOMI. Head: Atraumatic. Cardiovascular: Normal rate, regular rhythm. Normal S1 and S2.  Good peripheral circulation. Respiratory: Normal respiratory effort without tachypnea or retractions. Lungs CTAB. Good air entry to the bases with no decreased or absent breath sounds. Musculoskeletal: No deformity noted to the right hand or digits on the right hand. Third digit is slightly angled towards the ulnar aspect of the hand.  Patient has a "catching" motion with full extension. However, patient is able to have full range of motion to the third digit. Full resisted range of motion. Sensation and cap refill intact distally. There is mild edema noted above the extensor tendon pulley just proximal to the MCP joint. Neurologic:  Normal speech and language. No gross focal neurologic deficits are appreciated.  Skin:  Skin is warm, dry and intact. No rash noted. Psychiatric: Mood and affect are normal. Speech and behavior are normal. Patient exhibits appropriate insight and judgement.   ____________________________________________   LABS (all labs ordered are listed, but only abnormal results are displayed)  Labs Reviewed - No data to display ____________________________________________  EKG   ____________________________________________  RADIOLOGY 09/17/15 C[place]riell, personally viewed and evaluated these images (plain radiographs) as part of my medical decision making, as well as reviewing the written report by the radiologist.  Dg Finger Middle Right  07/18/2015  CLINICAL DATA:  50 year old male with acute right middle finger injury and pain. Initial encounter. EXAM: RIGHT MIDDLE FINGER 2+V COMPARISON:  None. FINDINGS: There is no evidence of fracture or dislocation. There is no evidence of arthropathy or other focal bone abnormality. Mild soft tissue swelling is noted. IMPRESSION: Mild soft tissue swelling without bony or joint abnormality. Electronically Signed   By: 59 M.D.   On: 07/18/2015 19:49    ____________________________________________    PROCEDURES  Procedure(s) performed:       Medications - No data to display   ____________________________________________   INITIAL IMPRESSION / ASSESSMENT AND PLAN / ED COURSE  Pertinent labs & imaging results that were available during my care of the patient were reviewed by me and considered in my medical decision making (see chart for  details).  Patient's diagnosis is consistent with injury to the third digit right hand. Appears to be swelling over the extensor tendon but no tendon injury is appreciated on exam. X-ray reveals no acute osseous abnormality. Patient's finger is splinted in the emergency department and he is given anti-inflammatories for improvement of symptoms. Patient is encouraged to follow up with orthopedics if symptoms persist past anti-inflammatory treatment course..  Patient is given ED precautions to return to the ED for any worsening or new symptoms.     ____________________________________________  FINAL CLINICAL IMPRESSION(S) / ED DIAGNOSES  Final diagnoses:  Finger injury, right, initial encounter      NEW MEDICATIONS STARTED DURING THIS VISIT:  Discharge Medication List as of 07/18/2015 10:03 PM    START taking these medications   Details  meloxicam (MOBIC) 15 MG tablet Take 1 tablet (  15 mg total) by mouth daily., Starting 07/18/2015, Until Discontinued, Print            This chart was dictated using voice recognition software/Dragon. Despite best efforts to proofread, errors can occur which can change the meaning. Any change was purely unintentional.    Racheal Patches, PA-C 07/18/15 [id number]  Emily Filbert, MD 07/18/15 [phone]

## 2015-07-18 NOTE — ED Notes (Signed)
Presents with an obvious dislocation of right middle finger. 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

## 2015-07-22 ENCOUNTER — Other Ambulatory Visit: Payer: Self-pay

## 2015-07-22 NOTE — Telephone Encounter (Signed)
Pt left v/m requesting refill transderm scope patches; [place] going on [place] for 1 [place] [facility]; pt request cb when refilled. Pt last seen 01/27/15 for annual exam and last refilled # 10 x 4 on 07/04/13.

## 2015-07-23 MED ORDER — SCOPOLAMINE 1 MG/3DAYS TD PT72: 1.0000 | MEDICATED_PATCH | TRANSDERMAL | Status: DC

## 2015-08-12 ENCOUNTER — Telehealth: Payer: Self-pay

## 2015-08-12 NOTE — Telephone Encounter (Signed)
Pt has appt to see Dr Patsy Lager as f/u for [facility] ED on 08/21/15; pt wants to know if could be seen sooner or should pt see orthopedic doctor. Offered pt 3 different appts on 08/13/15 but pt said he did not want to take vacation time to come to the doctor. Pt kept appt on 08/21/15. FYI to Dr Patsy Lager.

## 2015-08-13 ENCOUNTER — Other Ambulatory Visit: Payer: Self-pay | Admitting: Family Medicine

## 2015-08-13 ENCOUNTER — Ambulatory Visit: Payer: BLUE CROSS/BLUE SHIELD | Admitting: Family Medicine

## 2015-08-13 DIAGNOSIS — M79646 Pain in unspecified finger(s): Secondary | ICD-10-CM

## 2015-08-13 NOTE — Telephone Encounter (Signed)
i have to be out of the office for personal matters, if he would like to see someone sooner, then an orthopedic surgical evaluation would be reasonable. His choice.

## 2015-08-13 NOTE — Telephone Encounter (Addendum)
Mr. [place] notified as instructed by telephone.  Dr. Patsy Lager did have some open appointments on 08/20/2015 so I gave him the option of moving his appointment up a day or moving forward with orthopedic referral.  He would like to see Dr. Patsy Lager on Wednesday 08/20/15.  Appointment scheduled at 4:00 pm.  He will call me back if he changes his mind about referral.

## 2015-08-13 NOTE — Telephone Encounter (Signed)
Patient called back.  Patient said he would like to be referred to an Orthopaedic Surgeon in [place].  Patient can be reached at [phone].

## 2015-08-13 NOTE — Telephone Encounter (Signed)
done

## 2015-08-14 ENCOUNTER — Encounter (HOSPITAL_BASED_OUTPATIENT_CLINIC_OR_DEPARTMENT_OTHER): Payer: Self-pay | Admitting: *Deleted

## 2015-08-14 NOTE — H&P (Signed)
David Sheppard is an 50 y.o. male.   CC / Reason for Visit: Right long finger problem HPI: This patient is a 50 year old RHD male who presents for evaluation of a right long finger injury that occurred on the date above.  He reports that he was trying to flex something with his finger, when he abruptly had a change in function.  He was evaluated at [facility] in the ER, where a splint was applied.  The precise details of the follow-up plan are not exactly clear to me.   Past Medical History  Diagnosis Date  . Allergy   . GERD (gastroesophageal reflux disease)   . Migraine   . Generalized anxiety disorder 07/30/2011  . Panic attacks 07/30/2011    Past Surgical History  Procedure Laterality Date  . Hernia repair    . Tonsillectomy and adenoidectomy      Family History  Problem Relation Age of Onset  . Hypertension Father   . Hyperlipidemia Paternal Grandmother   . Diabetes Paternal Grandmother   . Heart disease Paternal Grandfather   . Hyperlipidemia Paternal Grandfather   . Diabetes Paternal Grandfather    Social History:  reports that he has been smoking Cigarettes.  He has been smoking about 0.50 packs per day. He has quit using smokeless tobacco. He reports that he drinks alcohol. He reports that he does not use illicit drugs.  Allergies: No Known Allergies  No prescriptions prior to admission    No results found for this or any previous visit (from the past 48 hour(s)). No results found.  Review of Systems  All other systems reviewed and are negative.   There were no vitals taken for this visit. Physical Exam   Constitutional:  WD, WN, NAD HEENT:  NCAT, EOMI Neuro/Psych:  Alert & oriented to person, place, and time; appropriate mood & affect Lymphatic: No generalized UE edema or lymphadenopathy Extremities / MSK:  Both UE are normal with respect to appearance, ranges of motion, joint stability, muscle strength/tone, sensation, & perfusion except as otherwise  noted:  Right long finger with a typical triggering, obvious radial sagittal band rupture with ulnar dislocation of the central tendon that centralizes and extension, accompanied by snapping.  The finger also tends to ulnarly deviate.  Labs / Xrays:  No radiographic studies obtained today.  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  I recommended surgical repair and detailed aspects of the postoperative immobilization program.  I think he may be able to do at least many aspects of his work during the postoperative recovery if properly splinted.  This might allow for minimal amount of time away from work.  He will speak with his employer and his family tonight, and call us later today or first thing in the morning regarding his decision whether to proceed on Monday or not.  We will go ahead and schedule surgery, canceling or postponing if necessary.  The details of the operative procedure were discussed with the patient.  Questions were invited and answered.  In addition to the goal of the procedure, the risks of the procedure to include but not limited to bleeding; infection; damage to the nerves or blood vessels that could result in bleeding, numbness, weakness, chronic pain, and the need for additional procedures; stiffness; the need for revision surgery; and anesthetic risks were reviewed.  No specific outcome was guaranteed  or implied.  Informed consent was obtained.  Nataly Pacifico A., MD 08/14/2015, 3:30 PM

## 2015-08-18 ENCOUNTER — Ambulatory Visit (HOSPITAL_BASED_OUTPATIENT_CLINIC_OR_DEPARTMENT_OTHER)
Admission: RE | Admit: 2015-08-18 | Payer: BLUE CROSS/BLUE SHIELD | Source: Ambulatory Visit | Admitting: Orthopedic Surgery

## 2015-08-18 SURGERY — REPAIR, TENDON, EXTENSOR
Anesthesia: Monitor Anesthesia Care | Site: Finger | Laterality: Right

## 2015-08-20 ENCOUNTER — Ambulatory Visit: Payer: BLUE CROSS/BLUE SHIELD | Admitting: Family Medicine

## 2015-08-21 ENCOUNTER — Ambulatory Visit: Payer: BLUE CROSS/BLUE SHIELD | Admitting: Family Medicine

## 2015-08-26 ENCOUNTER — Other Ambulatory Visit: Payer: Self-pay | Admitting: Family Medicine

## 2015-09-18 ENCOUNTER — Other Ambulatory Visit: Payer: Self-pay | Admitting: Family Medicine

## 2015-09-19 NOTE — Telephone Encounter (Signed)
Pt called to ck on status of imitrex refill; spoke with [facility] and rx ready for pick up. Pt voiced understanding.

## 2015-12-28 ENCOUNTER — Other Ambulatory Visit: Payer: Self-pay | Admitting: Family Medicine

## 2016-02-04 ENCOUNTER — Other Ambulatory Visit: Payer: Self-pay | Admitting: Family Medicine

## 2016-03-29 ENCOUNTER — Other Ambulatory Visit: Payer: Self-pay | Admitting: Family Medicine

## 2016-04-30 ENCOUNTER — Other Ambulatory Visit: Payer: Self-pay | Admitting: Family Medicine

## 2016-05-25 ENCOUNTER — Other Ambulatory Visit: Payer: Self-pay | Admitting: Family Medicine

## 2016-05-27 ENCOUNTER — Encounter: Payer: Self-pay | Admitting: Family Medicine

## 2016-05-27 ENCOUNTER — Ambulatory Visit (INDEPENDENT_AMBULATORY_CARE_PROVIDER_SITE_OTHER): Payer: BLUE CROSS/BLUE SHIELD | Admitting: Family Medicine

## 2016-05-27 VITALS — BP 106/78 | HR 79 | Temp 98.4°F | Ht 68.0 in | Wt 178.5 lb

## 2016-05-27 DIAGNOSIS — Z125 Encounter for screening for malignant neoplasm of prostate: Secondary | ICD-10-CM

## 2016-05-27 DIAGNOSIS — R5383 Other fatigue: Secondary | ICD-10-CM

## 2016-05-27 DIAGNOSIS — Z13220 Encounter for screening for lipoid disorders: Secondary | ICD-10-CM | POA: Diagnosis not present

## 2016-05-27 DIAGNOSIS — Z131 Encounter for screening for diabetes mellitus: Secondary | ICD-10-CM | POA: Diagnosis not present

## 2016-05-27 DIAGNOSIS — Z0000 Encounter for general adult medical examination without abnormal findings: Secondary | ICD-10-CM

## 2016-05-27 MED ORDER — CITALOPRAM HYDROBROMIDE 20 MG PO TABS: 20.0000 mg | ORAL_TABLET | Freq: Every day | ORAL | 3 refills | Status: DC

## 2016-05-27 MED ORDER — PANTOPRAZOLE SODIUM 40 MG PO TBEC: 40.0000 mg | DELAYED_RELEASE_TABLET | Freq: Every day | ORAL | 3 refills | Status: DC

## 2016-05-27 NOTE — Progress Notes (Signed)
Dr. Frederico Hamman T. Maybelle Depaoli, MD, [facility] [place], [address] Phone: [phone] Fax: [phone]  05/27/2016  Patient: David Sheppard, MRN: [id number], DOB: 09-Jan-1966, 51 y.o.  Primary Physician:  Owens Loffler, MD   Chief Complaint  Patient presents with  . Medication Refill   Subjective:   David Sheppard is a 51 y.o. pleasant patient who presents with the following:  Preventative Health Maintenance Visit:  Health Maintenance Summary Reviewed and updated, unless pt declines services.  HA not too bad  On celexa.  Tobacco History Reviewed. Alcohol: No concerns, no excessive use Exercise Habits: Some activity, rec at least 30 mins 5 times a week STD concerns: no risk or activity to increase risk Drug Use: None Encouraged self-testicular check  Health Maintenance  Topic Date Due  . HIV Screening  09/05/1982  . INFLUENZA VACCINE  06/12/2016 (Originally 10/14/2015)  . TETANUS/TDAP  06/04/2018   Immunization History  Administered Date(s) Administered  . Td 06/03/2008   Patient Active Problem List   Diagnosis Date Noted  . Generalized anxiety disorder 07/30/2011  . Panic attacks 07/30/2011  . OTHER MALAISE AND FATIGUE 06/24/2008  . GERD 06/21/2008  . MIGRAINE HEADACHE 06/04/2008  . ALLERGIC RHINITIS 06/04/2008   Past Medical History:  Diagnosis Date  . Allergy   . Generalized anxiety disorder 07/30/2011  . GERD (gastroesophageal reflux disease)   . Migraine   . Panic attacks 07/30/2011   Past Surgical History:  Procedure Laterality Date  . HERNIA REPAIR    . TONSILLECTOMY AND ADENOIDECTOMY     Social History   Social History  . Marital status: Married    Spouse name: N/A  . Number of children: 2  . Years of education: N/A   Occupational History  . injection mold setup machinery     Social History Main Topics  . Smoking status: Current Every Day Smoker    Packs/day: 0.50    Types:  Cigarettes  . Smokeless tobacco: Former Systems developer  . Alcohol use 0.0 oz/week     Comment: occ  . Drug use: No  . Sexual activity: Not on file   Other Topics Concern  . Not on file   Social History Narrative   1 daily caffiene use   Regular exercise-walking         Family History  Problem Relation Age of Onset  . Hypertension Father   . Hyperlipidemia Paternal Grandmother   . Diabetes Paternal Grandmother   . Heart disease Paternal Grandfather   . Hyperlipidemia Paternal Grandfather   . Diabetes Paternal Grandfather    No Known Allergies  Medication list has been reviewed and updated.   General: Denies fever, chills, sweats. No significant weight loss. Eyes: Denies blurring,significant itching ENT: Denies earache, sore throat, and hoarseness. Cardiovascular: Denies chest pains, palpitations, dyspnea on exertion Respiratory: Denies cough, dyspnea at rest,wheeezing Breast: no concerns about lumps GI: Denies nausea, vomiting, diarrhea, constipation, change in bowel habits, abdominal pain, melena, hematochezia GU: Denies penile discharge, ED, urinary flow / outflow problems. No STD concerns. Musculoskeletal: Denies back pain, joint pain Derm: Denies rash, itching Neuro: Denies  paresthesias, frequent falls, frequent headaches Psych: Denies depression, anxiety Endocrine: Denies cold intolerance, heat intolerance, polydipsia Heme: Denies enlarged lymph nodes Allergy: No hayfever  Objective:   BP 106/78 (BP Location: Right Arm, Patient Position: Sitting, Cuff Size: Normal)   Pulse 79   Temp 98.4 F (36.9 C) (Oral)   Ht  5' 8"  (1.727 m)   Wt 178 lb 8 oz (81 kg)   SpO2 97%   BMI 27.14 kg/m  Ideal Body Weight: Weight in (lb) to have BMI = 25: 164.1  No exam data present  GEN: well developed, well nourished, no acute distress Eyes: conjunctiva and lids normal, PERRLA, EOMI ENT: TM clear, nares clear, oral exam WNL Neck: supple, no lymphadenopathy, no thyromegaly, no  JVD Pulm: clear to auscultation and percussion, respiratory effort normal CV: regular rate and rhythm, S1-S2, no murmur, rub or gallop, no bruits, peripheral pulses normal and symmetric, no cyanosis, clubbing, edema or varicosities GI: soft, non-tender; no hepatosplenomegaly, masses; active bowel sounds all quadrants GU: no hernia, testicular mass, penile discharge Lymph: no cervical, axillary or inguinal adenopathy MSK: gait normal, muscle tone and strength WNL, no joint swelling, effusions, discoloration, crepitus  SKIN: clear, good turgor, color WNL, no rashes, lesions, or ulcerations Neuro: normal mental status, normal strength, sensation, and motion Psych: alert; oriented to person, place and time, normally interactive and not anxious or depressed in appearance.  All labs reviewed with patient. 2018 labs are pending  Lipids:    Component Value Date/Time   CHOL 206 (H) 01/22/2015 0946   TRIG 107.0 01/22/2015 0946   HDL 45.40 01/22/2015 0946   LDLDIRECT 121.9 07/29/2011 1645   VLDL 21.4 01/22/2015 0946   CHOLHDL 5 01/22/2015 0946   CBC: CBC Latest Ref Rng & Units 01/22/2015 12/26/2013 07/31/2013  WBC 4.0 - 10.5 K/uL 6.6 5.8 6.5  Hemoglobin 13.0 - 17.0 g/dL 15.4 14.8 8.5(L)  Hematocrit 39.0 - 52.0 % 45.4 43.9 24.6(L)  Platelets 150.0 - 400.0 K/uL 205.0 183.0 676    Basic Metabolic Panel:    Component Value Date/Time   NA 142 01/22/2015 0946   NA 143 07/30/2013 0348   K 4.2 01/22/2015 0946   K 3.7 07/30/2013 0348   CL 105 01/22/2015 0946   CL 118 (H) 07/30/2013 0348   CO2 30 01/22/2015 0946   CO2 25 07/30/2013 0348   BUN 10 01/22/2015 0946   BUN 8 07/30/2013 0348   CREATININE 0.83 01/22/2015 0946   CREATININE 0.87 07/30/2013 0348   GLUCOSE 89 01/22/2015 0946   GLUCOSE 104 (H) 07/30/2013 0348   CALCIUM 9.5 01/22/2015 0946   CALCIUM 7.0 (LL) 07/30/2013 0348   Hepatic Function Latest Ref Rng & Units 01/22/2015 12/26/2013 07/28/2013  Total Protein 6.0 - 8.3 g/dL 6.9 7.6  4.7(L)  Albumin 3.5 - 5.2 g/dL 4.4 3.6 2.4(L)  AST 0 - 37 U/L 18 36 101(H)  ALT 0 - 53 U/L 33 55(H) 107(H)  Alk Phosphatase 39 - 117 U/L 104 98 85  Total Bilirubin 0.2 - 1.2 mg/dL 0.6 0.8 0.1(L)  Bilirubin, Direct 0.0 - 0.3 mg/dL 0.1 0.1 -    Lab Results  Component Value Date   TSH 1.02 07/29/2011   Lab Results  Component Value Date   PSA 0.42 01/22/2015    Assessment and Plan:   Screening, lipid - Plan: Lipid panel  Screening PSA (prostate specific antigen) - Plan: PSA  Screening for diabetes mellitus - Plan: Basic metabolic panel  Other fatigue - Plan: CBC with Differential/Platelet, Hepatic function panel  Health Maintenance Exam: The patient's preventative maintenance and recommended screening tests for an annual wellness exam were reviewed in full today. Brought up to date unless services declined.  Counselled on the importance of diet, exercise, and its role in overall health and mortality. The patient's FH and SH was reviewed,  including their home life, tobacco status, and drug and alcohol status.  Follow-up in 1 year for physical exam or additional follow-up below.  Follow-up: No Follow-up on file. Or follow-up in 1 year if not noted.  Meds ordered this encounter  Medications  . pantoprazole (PROTONIX) 40 MG tablet    Sig: Take 1 tablet (40 mg total) by mouth daily.    Dispense:  90 tablet    Refill:  3  . citalopram (CELEXA) 20 MG tablet    Sig: Take 1 tablet (20 mg total) by mouth daily.    Dispense:  90 tablet    Refill:  3   Medications Discontinued During This Encounter  Medication Reason  . pantoprazole (PROTONIX) 40 MG tablet Reorder  . citalopram (CELEXA) 20 MG tablet Reorder   Orders Placed This Encounter  Procedures  . Basic metabolic panel  . CBC with Differential/Platelet  . Hepatic function panel  . Lipid panel  . PSA    Signed,  Frederico Hamman T. Brylei Pedley, MD   Allergies as of 05/27/2016   No Known Allergies     Medication List         Accurate as of 05/27/16 11:59 PM. Always use your most recent med list.          citalopram 20 MG tablet Commonly known as:  CELEXA Take 1 tablet (20 mg total) by mouth daily.   pantoprazole 40 MG tablet Commonly known as:  PROTONIX Take 1 tablet (40 mg total) by mouth daily.   SUMAtriptan 100 MG tablet Commonly known as:  IMITREX TAKE ONE TABLET AS NEEDED FOR MIGRAINE. MAY REPEAT IN TWO HOURS IF NEEDED *MAX OF 2 TABLETS IN 24 HOURS*

## 2016-05-27 NOTE — Progress Notes (Signed)
Pre visit review using our clinic review tool, if applicable. No additional management support is needed unless otherwise documented below in the visit note. 

## 2016-05-28 LAB — PSA: PSA: 0.55 ng/mL (ref 0.10–4.00)

## 2016-05-28 LAB — CBC WITH DIFFERENTIAL/PLATELET
BASOS PCT: 0.8 % (ref 0.0–3.0)
Basophils Absolute: 0.1 10*3/uL (ref 0.0–0.1)
EOS ABS: 0.5 10*3/uL (ref 0.0–0.7)
EOS PCT: 6.7 % — AB (ref 0.0–5.0)
HCT: 43 % (ref 39.0–52.0)
Hemoglobin: 14.7 g/dL (ref 13.0–17.0)
LYMPHS ABS: 2.5 10*3/uL (ref 0.7–4.0)
Lymphocytes Relative: 33.8 % (ref 12.0–46.0)
MCHC: 34.3 g/dL (ref 30.0–36.0)
MCV: 93.5 fl (ref 78.0–100.0)
MONO ABS: 0.5 10*3/uL (ref 0.1–1.0)
MONOS PCT: 6.7 % (ref 3.0–12.0)
NEUTROS ABS: 3.9 10*3/uL (ref 1.4–7.7)
NEUTROS PCT: 52 % (ref 43.0–77.0)
Platelets: 170 10*3/uL (ref 150.0–400.0)
RBC: 4.6 Mil/uL (ref 4.22–5.81)
RDW: 12.7 % (ref 11.5–15.5)
WBC: 7.4 10*3/uL (ref 4.0–10.5)

## 2016-05-28 LAB — BASIC METABOLIC PANEL
BUN: 11 mg/dL (ref 6–23)
CHLORIDE: 103 meq/L (ref 96–112)
CO2: 27 meq/L (ref 19–32)
Calcium: 9.4 mg/dL (ref 8.4–10.5)
Creatinine, Ser: 0.95 mg/dL (ref 0.40–1.50)
GFR: 89.66 mL/min (ref >60.00)
GLUCOSE: 78 mg/dL (ref 70–99)
POTASSIUM: 4 meq/L (ref 3.5–5.1)
Sodium: 136 mEq/L (ref 135–145)

## 2016-05-28 LAB — LIPID PANEL
CHOLESTEROL: 186 mg/dL (ref 0–200)
HDL: 45.8 mg/dL (ref >39.00)
LDL CALC: 116 mg/dL — AB (ref 0–99)
NonHDL: 140.21
TRIGLYCERIDES: 120 mg/dL (ref 0.0–149.0)
Total CHOL/HDL Ratio: 4
VLDL: 24 mg/dL (ref 0.0–40.0)

## 2016-05-28 LAB — HEPATIC FUNCTION PANEL
ALT: 35 U/L (ref 0–53)
AST: 20 U/L (ref 0–37)
Albumin: 4.3 g/dL (ref 3.5–5.2)
Alkaline Phosphatase: 79 U/L (ref 39–117)
BILIRUBIN TOTAL: 0.3 mg/dL (ref 0.2–1.2)
Bilirubin, Direct: 0.1 mg/dL (ref 0.0–0.3)
Total Protein: 6.7 g/dL (ref 6.0–8.3)

## 2016-05-31 ENCOUNTER — Encounter: Payer: Self-pay | Admitting: *Deleted

## 2016-06-16 ENCOUNTER — Ambulatory Visit (INDEPENDENT_AMBULATORY_CARE_PROVIDER_SITE_OTHER): Payer: BLUE CROSS/BLUE SHIELD | Admitting: Medical

## 2016-06-16 ENCOUNTER — Encounter: Payer: Self-pay | Admitting: Medical

## 2016-06-16 VITALS — BP 124/80 | HR 67 | Temp 98.6°F | Resp 16 | Ht 68.0 in | Wt 178.0 lb

## 2016-06-16 DIAGNOSIS — J301 Allergic rhinitis due to pollen: Secondary | ICD-10-CM | POA: Diagnosis not present

## 2016-06-16 DIAGNOSIS — J40 Bronchitis, not specified as acute or chronic: Secondary | ICD-10-CM | POA: Diagnosis not present

## 2016-06-16 MED ORDER — DOXYCYCLINE HYCLATE 100 MG PO TABS: 100.0000 mg | ORAL_TABLET | Freq: Two times a day (BID) | ORAL | 0 refills | Status: DC

## 2016-06-16 MED ORDER — FLUTICASONE PROPIONATE 50 MCG/ACT NA SUSP: 2.0000 | Freq: Every day | NASAL | 1 refills | Status: DC

## 2016-06-16 MED ORDER — BENZONATATE 100 MG PO CAPS
100.0000 mg | ORAL_CAPSULE | Freq: Three times a day (TID) | ORAL | 0 refills | Status: DC | PRN
Start: 2016-06-16 — End: 2017-06-24

## 2016-06-16 NOTE — Patient Instructions (Addendum)
You appear to have bronchitis with some allergic rhinitis. Rest hydrate and tylenol for fever. I am prescribing cough medicine benzonatate. For your nasal congestion nasal steroid flonase. Making doxycycline available in event you worsen as explained.  For wheezing rx albuterol  You should gradually get better. If not then notify us and would recommend a chest xray.  Follow up in 7-10 days or as needed

## 2016-06-16 NOTE — Progress Notes (Signed)
Pre visit review using our clinic review tool, if applicable. No additional management support is needed unless otherwise documented below in the visit note. 

## 2016-06-16 NOTE — Progress Notes (Signed)
Subjective:    Patient ID: David Sheppard, male    DOB: [birth year], 51 y.o.   MRN: [id number]  HPI  Pt in with cough since Monday/ 2 days. Mostly dry cough. Slight wheeze when he coughs. Subjective fever at home. No sweats or chills. Sunday felt mild achy only one night.(no ache since).  Pt does smoke about 1/2 pack a day. On and off smoker In past  but some consistent past 2 years.  No prior use of inhalers. No hx of asthma.  Some nasal congestion. Mild post nasal drainage.   Review of Systems  Constitutional: Negative for chills, fatigue and fever.  HENT: Positive for congestion and postnasal drip. Negative for facial swelling, nosebleeds, sinus pain, sinus pressure, sneezing and sore throat.   Respiratory: Positive for cough and wheezing. Negative for chest tightness.   Cardiovascular: Negative for chest pain and palpitations.  Gastrointestinal: Negative for abdominal pain, diarrhea, nausea and vomiting.  Musculoskeletal: Negative for back pain and joint swelling.  Skin: Negative for rash.  Neurological: Negative for dizziness, weakness, light-headedness, numbness and headaches.  Hematological: Negative for adenopathy. Does not bruise/bleed easily.  Psychiatric/Behavioral: Negative for behavioral problems and confusion.   Past Medical History:  Diagnosis Date  . Allergy   . Generalized anxiety disorder 07/30/2011  . GERD (gastroesophageal reflux disease)   . Migraine   . Panic attacks 07/30/2011     Social History   Social History  . Marital status: Married    Spouse name: N/A  . Number of children: 2  . Years of education: N/A   Occupational History  . injection mold setup machinery     Social History Main Topics  . Smoking status: Current Every Day Smoker    Packs/day: 0.50    Types: Cigarettes  . Smokeless tobacco: Former Neurosurgeon  . Alcohol use 0.0 oz/week     Comment: occ  . Drug use: No  . Sexual activity: Not on file   Other Topics Concern  . Not on  file   Social History Narrative   1 daily caffiene use   Regular exercise-walking          Past Surgical History:  Procedure Laterality Date  . HERNIA REPAIR    . TONSILLECTOMY AND ADENOIDECTOMY      Family History  Problem Relation Age of Onset  . Hypertension Father   . Hyperlipidemia Paternal Grandmother   . Diabetes Paternal Grandmother   . Heart disease Paternal Grandfather   . Hyperlipidemia Paternal Grandfather   . Diabetes Paternal Grandfather     No Known Allergies  Current Outpatient Prescriptions on File Prior to Visit  Medication Sig Dispense Refill  . citalopram (CELEXA) 20 MG tablet Take 1 tablet (20 mg total) by mouth daily. 90 tablet 3  . pantoprazole (PROTONIX) 40 MG tablet Take 1 tablet (40 mg total) by mouth daily. 90 tablet 3  . SUMAtriptan (IMITREX) 100 MG tablet TAKE ONE TABLET AS NEEDED FOR MIGRAINE. MAY REPEAT IN TWO HOURS IF NEEDED *MAX OF 2 TABLETS IN 24 HOURS* 9 tablet 5   No current facility-administered medications on file prior to visit.     BP 124/80 (BP Location: Right Arm, Cuff Size: Normal)   Pulse 67   Temp 98.6 F (37 C) (Oral)   Resp 16   Ht 5\' 8"  (1.727 m)   Wt 178 lb (80.7 kg)   SpO2 98%   BMI 27.06 kg/m       Objective:  Physical Exam  General  Mental Status - Alert. General Appearance - Well groomed. Not in acute distress.  Skin Rashes- No Rashes.  HEENT Head- Normal. Ear Auditory Canal - Left- Normal. Right - Normal.Tympanic Membrane- Left- Normal. Right- Normal. Eye Sclera/Conjunctiva- Left- Normal. Right- Normal. Nose & Sinuses Nasal Mucosa- Left-  Boggy and Congested. Right-  Boggy and  Congested.Bilateral  No maxillary and  No frontal sinus pressure. Mouth & Throat Lips: Upper Lip- Normal: no dryness, cracking, pallor, cyanosis, or vesicular eruption. Lower Lip-Normal: no dryness, cracking, pallor, cyanosis or vesicular eruption. Buccal Mucosa- Bilateral- No Aphthous ulcers. Oropharynx- No Discharge or  Erythema. +pnd Tonsils: Characteristics- Bilateral- No Erythema or Congestion. Size/Enlargement- Bilateral- No enlargement. Discharge- bilateral-None.  Neck Neck- Supple. No Masses.   Chest and Lung Exam Auscultation: Breath Sounds:-Clear even and unlabored.  Cardiovascular Auscultation:Rythm- Regular, rate and rhythm. Murmurs & Other Heart Sounds:Ausculatation of the heart reveal- No Murmurs.  Lymphatic Head & Neck General Head & Neck Lymphatics: Bilateral: Description- No Localized lymphadenopathy.      Assessment & Plan:  You appear to have bronchitis with some allergic rhinitis. Rest hydrate and tylenol for fever. I am prescribing cough medicine benzonatate. For your nasal congestion nasal steroid flonase. Making doxycycline available in event you worsen as explained.  For wheezing rx albuterol  You should gradually get better. If not then notify us and would recommend a chest xray.  Follow up in 7-10 days or as needed  David Sheppard, David Sheppard, David Sheppard

## 2016-11-03 ENCOUNTER — Other Ambulatory Visit: Payer: Self-pay | Admitting: Family Medicine

## 2017-06-24 ENCOUNTER — Encounter: Payer: Self-pay | Admitting: Family Medicine

## 2017-06-24 ENCOUNTER — Ambulatory Visit: Payer: BLUE CROSS/BLUE SHIELD | Admitting: Family Medicine

## 2017-06-24 DIAGNOSIS — B029 Zoster without complications: Secondary | ICD-10-CM | POA: Diagnosis not present

## 2017-06-24 MED ORDER — VALACYCLOVIR HCL 1 G PO TABS: 1000.0000 mg | ORAL_TABLET | Freq: Three times a day (TID) | ORAL | 0 refills | Status: DC

## 2017-06-24 NOTE — Patient Instructions (Signed)
Start valtrex.  Keep the area covered.  Call the eye clinic and tell them you have shingles on your face.  Take care.  Glad to see you.

## 2017-06-24 NOTE — Progress Notes (Signed)
He is trying to quit smoking, d/w pt.    Rash.  Noted about 10 days ago.  Started on the scalp.  He didn't know if it was a bite initially.  Then noted possible LA on the R side.  Painful now.  Burning pain.  No L sided pain.  R side of scalp has altered sensation on V1 and possibly V2 distribution.  No V3 changes.  No vision changes.  No eye pain.    Meds, vitals, and allergies reviewed.   ROS: Per HPI unless specifically indicated in ROS section   nad ncat 2 resolving small lesions on the right side of the scalp in the V1 distribution.  No other similar lesions.  Altered V1 sensation noted on the right side.  Normal left V1 sensation.  No lesions around either eye.  No conjunctival changes noted. EOMI PERRL MMM Oropharynx within normal limits. Neck with a single right-sided lymph node noted, and the posterior change inferior to the right ear.

## 2017-06-25 DIAGNOSIS — B029 Zoster without complications: Secondary | ICD-10-CM | POA: Insufficient documentation

## 2017-06-25 NOTE — Assessment & Plan Note (Signed)
Shingles pathophysiology discussed with patient.  Fortunately patient has relatively mild symptoms at this point.  He has no signs or symptoms of ocular involvement.  Routine cautions given. Start valtrex.  Keep the area covered.  He'll call the eye clinic and tell them you have shingles on his face.  Update me as needed.

## 2017-06-29 ENCOUNTER — Other Ambulatory Visit: Payer: Self-pay | Admitting: Family Medicine

## 2017-07-19 ENCOUNTER — Telehealth: Payer: Self-pay | Admitting: Family Medicine

## 2017-07-19 ENCOUNTER — Other Ambulatory Visit: Payer: Self-pay | Admitting: Family Medicine

## 2017-07-19 MED ORDER — VARENICLINE TARTRATE 1 MG PO TABS: 1.0000 mg | ORAL_TABLET | Freq: Two times a day (BID) | ORAL | 3 refills | Status: DC

## 2017-07-19 MED ORDER — VARENICLINE TARTRATE 0.5 MG X 11 & 1 MG X 42 PO MISC: ORAL | 0 refills | Status: DC

## 2017-07-19 NOTE — Telephone Encounter (Signed)
Reasonable, done

## 2017-07-19 NOTE — Telephone Encounter (Signed)
Pt last annual 05/27/16 and pt has CPX scheduled 08/04/17. [facility]

## 2017-07-19 NOTE — Telephone Encounter (Signed)
Copied from CRM [id number]. Topic: Quick Communication - See Telephone Encounter >> Jul 19, 2017 10:16 AM Terisa Starr wrote: CRM for notification. See Telephone encounter for: 07/19/17.  Patient said that he would like to quit smoking and would like a prescription sent in for Chantix. Please advise.   [id number] - [place], [place] - [address]

## 2017-07-19 NOTE — Progress Notes (Signed)
done

## 2017-07-28 ENCOUNTER — Other Ambulatory Visit: Payer: Self-pay | Admitting: Family Medicine

## 2017-07-28 DIAGNOSIS — Z0000 Encounter for general adult medical examination without abnormal findings: Secondary | ICD-10-CM

## 2017-07-29 ENCOUNTER — Other Ambulatory Visit (INDEPENDENT_AMBULATORY_CARE_PROVIDER_SITE_OTHER): Payer: BLUE CROSS/BLUE SHIELD

## 2017-07-29 DIAGNOSIS — Z0000 Encounter for general adult medical examination without abnormal findings: Secondary | ICD-10-CM | POA: Diagnosis not present

## 2017-07-29 NOTE — Addendum Note (Signed)
Addended by: Wendi Maya on: 07/29/2017 03:41 PM   Modules accepted: Orders

## 2017-07-29 NOTE — Addendum Note (Signed)
Addended by: Whitaker Holderman on: 07/29/2017 03:41 PM   Modules accepted: Orders  

## 2017-07-29 NOTE — Addendum Note (Signed)
Addended by: Marcene Laskowski on: 07/29/2017 03:41 PM   Modules accepted: Orders  

## 2017-07-29 NOTE — Addendum Note (Signed)
Addended by: Lilibeth Opie on: 07/29/2017 03:41 PM   Modules accepted: Orders  

## 2017-07-29 NOTE — Addendum Note (Signed)
Addended by: Detrick Dani on: 07/29/2017 03:41 PM   Modules accepted: Orders  

## 2017-07-30 LAB — CBC WITH DIFFERENTIAL/PLATELET
BASOS ABS: 67 {cells}/uL (ref 0–200)
BASOS PCT: 1 %
EOS ABS: 288 {cells}/uL (ref 15–500)
EOS PCT: 4.3 %
HCT: 40.1 % (ref 38.5–50.0)
Hemoglobin: 14.4 g/dL (ref 13.2–17.1)
Lymphs Abs: 2037 cells/uL (ref 850–3900)
MCH: 32.6 pg (ref 27.0–33.0)
MCHC: 35.9 g/dL (ref 32.0–36.0)
MCV: 90.7 fL (ref 80.0–100.0)
MONOS PCT: 7.5 %
MPV: 11 fL (ref 7.5–12.5)
NEUTROS PCT: 56.8 %
Neutro Abs: 3806 cells/uL (ref 1500–7800)
PLATELETS: 187 10*3/uL (ref 140–400)
RBC: 4.42 10*6/uL (ref 4.20–5.80)
RDW: 11.9 % (ref 11.0–15.0)
TOTAL LYMPHOCYTE: 30.4 %
WBC: 6.7 10*3/uL (ref 3.8–10.8)
WBCMIX: 503 {cells}/uL (ref 200–950)

## 2017-07-30 LAB — HEPATIC FUNCTION PANEL
AG Ratio: 1.7 (calc) (ref 1.0–2.5)
ALBUMIN MSPROF: 4.1 g/dL (ref 3.6–5.1)
ALT: 26 U/L (ref 9–46)
AST: 21 U/L (ref 10–40)
Alkaline phosphatase (APISO): 75 U/L (ref 40–115)
BILIRUBIN DIRECT: 0.1 mg/dL (ref 0.0–0.2)
Globulin: 2.4 g/dL (calc) (ref 1.9–3.7)
Indirect Bilirubin: 0.2 mg/dL (calc) (ref 0.2–1.2)
TOTAL PROTEIN: 6.5 g/dL (ref 6.1–8.1)
Total Bilirubin: 0.3 mg/dL (ref 0.2–1.2)

## 2017-07-30 LAB — BASIC METABOLIC PANEL
BUN: 14 mg/dL (ref 7–25)
CO2: 28 mmol/L (ref 20–32)
CREATININE: 1 mg/dL (ref 0.60–1.35)
Calcium: 9.2 mg/dL (ref 8.6–10.3)
Chloride: 105 mmol/L (ref 98–110)
GLUCOSE: 92 mg/dL (ref 65–99)
Potassium: 3.9 mmol/L (ref 3.5–5.3)
SODIUM: 138 mmol/L (ref 135–146)

## 2017-07-30 LAB — LIPID PANEL
Cholesterol: 174 mg/dL (ref <200)
HDL: 43 mg/dL (ref >40)
LDL CHOLESTEROL (CALC): 106 mg/dL — AB
Non-HDL Cholesterol (Calc): 131 mg/dL (calc) — ABNORMAL HIGH (ref <130)
TRIGLYCERIDES: 133 mg/dL (ref <150)
Total CHOL/HDL Ratio: 4 (calc) (ref <5.0)

## 2017-07-30 LAB — PSA: PSA: 0.4 ng/mL (ref <=4.0)

## 2017-08-04 ENCOUNTER — Ambulatory Visit (INDEPENDENT_AMBULATORY_CARE_PROVIDER_SITE_OTHER): Payer: BLUE CROSS/BLUE SHIELD | Admitting: Family Medicine

## 2017-08-04 ENCOUNTER — Encounter: Payer: Self-pay | Admitting: Family Medicine

## 2017-08-04 ENCOUNTER — Other Ambulatory Visit: Payer: Self-pay

## 2017-08-04 VITALS — BP 100/64 | HR 78 | Temp 98.5°F | Ht 67.25 in | Wt 178.2 lb

## 2017-08-04 DIAGNOSIS — Z1211 Encounter for screening for malignant neoplasm of colon: Secondary | ICD-10-CM

## 2017-08-04 DIAGNOSIS — Z0000 Encounter for general adult medical examination without abnormal findings: Secondary | ICD-10-CM

## 2017-08-04 NOTE — Patient Instructions (Signed)
REFERRALS TO SPECIALISTS, SPECIAL TESTS (MRI, CT, ULTRASOUNDS)  David Sheppard or  David Sheppard will help you. ASK CHECK-IN FOR HELP.  Specialist appointment times vary a great deal, based on their schedule / openings. -- Some specialists have very long wait times. (Example. Dermatology)    

## 2017-08-04 NOTE — Progress Notes (Signed)
Dr. Frederico Hamman T. Arliene Rosenow, MD, [facility] [place], [address] Phone: [phone] Fax: [phone]  08/04/2017  Patient: David Sheppard, MRN: [id number], DOB: 1965-04-25, 52 y.o.  Primary Physician:  Owens Loffler, MD   Chief Complaint  Patient presents with  . Annual Exam   Subjective:   David Sheppard is a 52 y.o. pleasant patient who presents with the following:  Preventative Health Maintenance Visit:  Health Maintenance Summary Reviewed and updated, unless pt declines services.  Tobacco History Reviewed. Alcohol: No concerns, no excessive use Exercise Habits: Some activity, rec at least 30 mins 5 times a week STD concerns: no risk or activity to increase risk Drug Use: None Encouraged self-testicular check  Colon?  Health Maintenance  Topic Date Due  . HIV Screening  09/05/1982  . INFLUENZA VACCINE  10/13/2017  . TETANUS/TDAP  06/04/2018   Immunization History  Administered Date(s) Administered  . Td 06/03/2008   Patient Active Problem List   Diagnosis Date Noted  . Generalized anxiety disorder 07/30/2011  . Panic attacks 07/30/2011  . OTHER MALAISE AND FATIGUE 06/24/2008  . GERD 06/21/2008  . MIGRAINE HEADACHE 06/04/2008  . ALLERGIC RHINITIS 06/04/2008   Past Medical History:  Diagnosis Date  . Allergy   . Generalized anxiety disorder 07/30/2011  . GERD (gastroesophageal reflux disease)   . Migraine   . Panic attacks 07/30/2011   Past Surgical History:  Procedure Laterality Date  . HERNIA REPAIR    . TONSILLECTOMY AND ADENOIDECTOMY     Social History   Socioeconomic History  . Marital status: Married    Spouse name: Not on file  . Number of children: 2  . Years of education: Not on file  . Highest education level: Not on file  Occupational History  . Occupation: injection mold setup machinery   Social Needs  . Financial resource strain: Not on file  . Food insecurity:   Worry: Not on file    Inability: Not on file  . Transportation needs:    Medical: Not on file    Non-medical: Not on file  Tobacco Use  . Smoking status: Current Every Day Smoker    Packs/day: 0.50    Types: Cigarettes  . Smokeless tobacco: Former Network engineer and Sexual Activity  . Alcohol use: Yes    Alcohol/week: 0.0 oz    Comment: occ  . Drug use: No  . Sexual activity: Not on file  Lifestyle  . Physical activity:    Days per week: Not on file    Minutes per session: Not on file  . Stress: Not on file  Relationships  . Social connections:    Talks on phone: Not on file    Gets together: Not on file    Attends religious service: Not on file    Active member of club or organization: Not on file    Attends meetings of clubs or organizations: Not on file    Relationship status: Not on file  . Intimate partner violence:    Fear of current or ex partner: Not on file    Emotionally abused: Not on file    Physically abused: Not on file    Forced sexual activity: Not on file  Other Topics Concern  . Not on file  Social History Narrative   1 daily caffiene use   Regular exercise-walking         Family History  Problem Relation  Age of Onset  . Hypertension Father   . Hyperlipidemia Paternal Grandmother   . Diabetes Paternal Grandmother   . Heart disease Paternal Grandfather   . Hyperlipidemia Paternal Grandfather   . Diabetes Paternal Grandfather    No Known Allergies  Medication list has been reviewed and updated.   General: Denies fever, chills, sweats. No significant weight loss. Eyes: Denies blurring,significant itching ENT: Denies earache, sore throat, and hoarseness. Cardiovascular: Denies chest pains, palpitations, dyspnea on exertion Respiratory: Denies cough, dyspnea at rest,wheeezing Breast: no concerns about lumps GI: Denies nausea, vomiting, diarrhea, constipation, change in bowel habits, abdominal pain, melena, hematochezia GU: Denies penile  discharge, ED, urinary flow / outflow problems. No STD concerns. Musculoskeletal: Denies back pain, joint pain Derm: Denies rash, itching Neuro: Denies  paresthesias, frequent falls, frequent headaches Psych: Denies depression, anxiety Endocrine: Denies cold intolerance, heat intolerance, polydipsia Heme: Denies enlarged lymph nodes Allergy: No hayfever  Objective:   BP 100/64   Pulse 78   Temp 98.5 F (36.9 C) (Oral)   Ht 5' 7.25" (1.708 m)   Wt 178 lb 4 oz (80.9 kg)   BMI 27.71 kg/m  Ideal Body Weight: Weight in (lb) to have BMI = 25: 160.5  No exam data present  GEN: well developed, well nourished, no acute distress Eyes: conjunctiva and lids normal, PERRLA, EOMI ENT: TM clear, nares clear, oral exam WNL Neck: supple, no lymphadenopathy, no thyromegaly, no JVD Pulm: clear to auscultation and percussion, respiratory effort normal CV: regular rate and rhythm, S1-S2, no murmur, rub or gallop, no bruits, peripheral pulses normal and symmetric, no cyanosis, clubbing, edema or varicosities GI: soft, non-tender; no hepatosplenomegaly, masses; active bowel sounds all quadrants GU: no hernia, testicular mass, penile discharge Lymph: no cervical, axillary or inguinal adenopathy MSK: gait normal, muscle tone and strength WNL, no joint swelling, effusions, discoloration, crepitus  SKIN: clear, good turgor, color WNL, no rashes, lesions, or ulcerations Neuro: normal mental status, normal strength, sensation, and motion Psych: alert; oriented to person, place and time, normally interactive and not anxious or depressed in appearance. All labs reviewed with patient.  Lipids:    Component Value Date/Time   CHOL 174 07/29/2017 1541   TRIG 133 07/29/2017 1541   HDL 43 07/29/2017 1541   LDLDIRECT 121.9 07/29/2011 1645   VLDL 24.0 05/27/2016 1724   CHOLHDL 4.0 07/29/2017 1541   CBC: CBC Latest Ref Rng & Units 07/29/2017 05/27/2016 01/22/2015  WBC 3.8 - 10.8 Thousand/uL 6.7 7.4 6.6    Hemoglobin 13.2 - 17.1 g/dL 14.4 14.7 15.4  Hematocrit 38.5 - 50.0 % 40.1 43.0 45.4  Platelets 140 - 400 Thousand/uL 187 170.0 786.7    Basic Metabolic Panel:    Component Value Date/Time   NA 138 07/29/2017 1541   NA 143 07/30/2013 0348   K 3.9 07/29/2017 1541   K 3.7 07/30/2013 0348   CL 105 07/29/2017 1541   CL 118 (H) 07/30/2013 0348   CO2 28 07/29/2017 1541   CO2 25 07/30/2013 0348   BUN 14 07/29/2017 1541   BUN 8 07/30/2013 0348   CREATININE 1.00 07/29/2017 1541   GLUCOSE 92 07/29/2017 1541   GLUCOSE 104 (H) 07/30/2013 0348   CALCIUM 9.2 07/29/2017 1541   CALCIUM 7.0 (LL) 07/30/2013 0348   Hepatic Function Latest Ref Rng & Units 07/29/2017 05/27/2016 01/22/2015  Total Protein 6.1 - 8.1 g/dL 6.5 6.7 6.9  Albumin 3.5 - 5.2 g/dL - 4.3 4.4  AST 10 - 40 U/L 21 20  18  ALT 9 - 46 U/L 26 35 33  Alk Phosphatase 39 - 117 U/L - 79 104  Total Bilirubin 0.2 - 1.2 mg/dL 0.3 0.3 0.6  Bilirubin, Direct 0.0 - 0.2 mg/dL 0.1 0.1 0.1    Lab Results  Component Value Date   TSH 1.02 07/29/2011   Lab Results  Component Value Date   PSA 0.4 07/29/2017   PSA 0.55 05/27/2016   PSA 0.42 01/22/2015    Assessment and Plan:   Routine general medical examination at a health care facility  Doing well overall. Work on smoking.   Colonoscopy referral.  Health Maintenance Exam: The patient's preventative maintenance and recommended screening tests for an annual wellness exam were reviewed in full today. Brought up to date unless services declined.  Counselled on the importance of diet, exercise, and its role in overall health and mortality. The patient's FH and SH was reviewed, including their home life, tobacco status, and drug and alcohol status.  Follow-up in 1 year for physical exam or additional follow-up below.  Follow-up: No follow-ups on file. Or follow-up in 1 year if not noted.  Signed,  Maud Deed. Kaylynne Andres, MD   Allergies as of 08/04/2017   No Known Allergies      Medication List        Accurate as of 08/04/17  3:06 PM. Always use your most recent med list.          citalopram 20 MG tablet Commonly known as:  CELEXA TAKE 1 TABLET BY MOUTH DAILY   pantoprazole 40 MG tablet Commonly known as:  PROTONIX TAKE 1 TABLET BY MOUTH DAILY   SUMAtriptan 100 MG tablet Commonly known as:  IMITREX TAKE ONE TABLET AS NEEDED FOR MIGRAINE. MAY REPEAT IN TWO HOURS IF NEEDED *MAX OF 2 TABLETS IN 24 HOURS*   varenicline 0.5 MG X 11 & 1 MG X 42 tablet Commonly known as:  CHANTIX STARTING MONTH PAK Take one 0.5 mg tablet by mouth daily for 3 days, then increase to one 0.5 mg tablet bid for 4 days, then increase to one 1 mg tab bid   varenicline 1 MG tablet Commonly known as:  CHANTIX CONTINUING MONTH PAK Take 1 tablet (1 mg total) by mouth 2 (two) times daily.

## 2017-08-12 ENCOUNTER — Other Ambulatory Visit: Payer: Self-pay | Admitting: Family Medicine

## 2017-08-12 ENCOUNTER — Encounter: Payer: Self-pay | Admitting: Family Medicine

## 2017-08-12 ENCOUNTER — Ambulatory Visit: Payer: BLUE CROSS/BLUE SHIELD | Admitting: Family Medicine

## 2017-08-12 DIAGNOSIS — L0390 Cellulitis, unspecified: Secondary | ICD-10-CM | POA: Diagnosis not present

## 2017-08-12 MED ORDER — TRIAMCINOLONE ACETONIDE 0.1 % EX CREA: 1.0000 "application " | TOPICAL_CREAM | Freq: Two times a day (BID) | CUTANEOUS | 0 refills | Status: DC | PRN

## 2017-08-12 MED ORDER — DOXYCYCLINE HYCLATE 100 MG PO TABS: 100.0000 mg | ORAL_TABLET | Freq: Two times a day (BID) | ORAL | 0 refills | Status: DC

## 2017-08-12 NOTE — Progress Notes (Signed)
Tick bit about 2 week ago.  L upper arm.  Target lesion locally, with some local central clearing.  Itchy locally.  He was able to get the whole tick off.  No fevers, no chills.  Some fatigue.    Prev shingles resolved w/o any eye involvement.  D/w pt.    He is on starter pack for chantix.  He is cutting back on smoking, with some relief from med.  No ADE on med other than some nausea and some more dreams.  Mood is good.    Meds, vitals, and allergies reviewed.   ROS: Per HPI unless specifically indicated in ROS section   nad ncat Neck supple, no LA 4"x4" round maculopapular lesion on L upper outer arm.  Border marked.  No other similar lesions.  No fluctuant mass. rrr ctab

## 2017-08-12 NOTE — Patient Instructions (Signed)
Start doxy, sunburn caution.  If you have spreading redness or a fever then update [place].   Use TAC if needed for itching.  Take care.  Glad to see you.

## 2017-08-14 DIAGNOSIS — L0390 Cellulitis, unspecified: Secondary | ICD-10-CM | POA: Insufficient documentation

## 2017-08-14 NOTE — Assessment & Plan Note (Signed)
Local cellulitis.  Discussed with patient.  Nontoxic.  Okay for outpatient follow-up.  Start doxycycline.  Routine doxycycline cautions given.  This could be just a simple local cellulitis that is not related to a tick bite in and of itself but just from the initial epithelial disruption.  Doxycycline should cover that.  Assuming the patient did have some type of tickborne illness that caused this local reaction, the doxycycline would still be appropriate.  Not systemically ill.  If he has other symptoms he can let us know.  He does have a local itching at the site, reasonable to use triamcinolone cream locally if needed.  Update me as needed.  He agrees.

## 2017-08-15 ENCOUNTER — Other Ambulatory Visit: Payer: Self-pay | Admitting: Family Medicine

## 2017-09-26 ENCOUNTER — Other Ambulatory Visit: Payer: Self-pay | Admitting: Family Medicine

## 2017-11-28 ENCOUNTER — Encounter: Payer: Self-pay | Admitting: Family Medicine

## 2017-11-28 ENCOUNTER — Ambulatory Visit: Payer: BLUE CROSS/BLUE SHIELD | Admitting: Family Medicine

## 2017-11-28 VITALS — BP 116/76 | HR 76 | Temp 98.4°F | Ht 67.25 in | Wt 181.2 lb

## 2017-11-28 DIAGNOSIS — M545 Low back pain, unspecified: Secondary | ICD-10-CM

## 2017-11-28 DIAGNOSIS — M79605 Pain in left leg: Secondary | ICD-10-CM

## 2017-11-28 MED ORDER — PREDNISONE 20 MG PO TABS
ORAL_TABLET | ORAL | 0 refills | Status: DC
Start: 2017-11-28 — End: 2018-03-15

## 2017-11-28 MED ORDER — TIZANIDINE HCL 4 MG PO TABS: 4.0000 mg | ORAL_TABLET | Freq: Every evening | ORAL | 1 refills | Status: AC

## 2017-11-28 NOTE — Progress Notes (Signed)
Dr. Karleen Hampshire T. Zakiah Gauthreaux, MD, [facility] [address] [place], [address] Phone: [phone] Fax: [phone]  11/28/2017  Patient: David Sheppard, MRN: [id number], DOB: 06-18-1965, 52 y.o.  Primary Physician:  Hannah Beat, MD   Chief Complaint  Patient presents with  . left side and hio pain    woke up yesterday morning with the pain   Subjective:   David Sheppard is a 52 y.o. very pleasant male patient who presents with the following: Back Pain  ongoing for approximately: 2 days The patient has had minimal back pain before. The back pain is localized into the lumbar spine area. They also describe radiculopathy to the L posterior thigh and buttocks.  Left back pain - started yesterday morning. Out of the blue with waking up.  Pain going down to his L hip.  Heating pad and tylenol.   No numbness or tingling. No bowel or bladder incontinence. No focal weakness. Prior interventions: none Physical therapy: No Chiropractic manipulations: No Acupuncture: No Osteopathic manipulation: No Heat or cold: Minimal effect  Past Medical History, Surgical History, Family History, Medications, Allergies have been reviewed and updated if relevant.  Patient Active Problem List   Diagnosis Date Noted  . Cellulitis 08/14/2017  . Generalized anxiety disorder 07/30/2011  . Panic attacks 07/30/2011  . OTHER MALAISE AND FATIGUE 06/24/2008  . GERD 06/21/2008  . MIGRAINE HEADACHE 06/04/2008  . ALLERGIC RHINITIS 06/04/2008    Past Medical History:  Diagnosis Date  . Allergy   . Generalized anxiety disorder 07/30/2011  . GERD (gastroesophageal reflux disease)   . Migraine   . Panic attacks 07/30/2011    Past Surgical History:  Procedure Laterality Date  . HERNIA REPAIR    . TONSILLECTOMY AND ADENOIDECTOMY      Social History   Socioeconomic History  . Marital status: Married    Spouse name: Not on file  . Number of  children: 2  . Years of education: Not on file  . Highest education level: Not on file  Occupational History  . Occupation: injection mold setup machinery   Social Needs  . Financial resource strain: Not on file  . Food insecurity:    Worry: Not on file    Inability: Not on file  . Transportation needs:    Medical: Not on file    Non-medical: Not on file  Tobacco Use  . Smoking status: Current Every Day Smoker    Packs/day: 0.50    Types: Cigarettes  . Smokeless tobacco: Former Engineer, water and Sexual Activity  . Alcohol use: Yes    Alcohol/week: 0.0 standard drinks    Comment: occ  . Drug use: No  . Sexual activity: Not on file  Lifestyle  . Physical activity:    Days per week: Not on file    Minutes per session: Not on file  . Stress: Not on file  Relationships  . Social connections:    Talks on phone: Not on file    Gets together: Not on file    Attends religious service: Not on file    Active member of club or organization: Not on file    Attends meetings of clubs or organizations: Not on file    Relationship status: Not on file  . Intimate partner violence:    Fear of current or ex partner: Not on file    Emotionally abused: Not on file    Physically abused: Not  on file    Forced sexual activity: Not on file  Other Topics Concern  . Not on file  Social History Narrative   1 daily caffiene use   Regular exercise-walking          Family History  Problem Relation Age of Onset  . Hypertension Father   . Hyperlipidemia Paternal Grandmother   . Diabetes Paternal Grandmother   . Heart disease Paternal Grandfather   . Hyperlipidemia Paternal Grandfather   . Diabetes Paternal Grandfather     No Known Allergies  Medication list reviewed and updated in full in [facility].  GEN: No fevers, chills. Nontoxic. Primarily MSK c/o today. MSK: Detailed in the HPI GI: tolerating PO intake without difficulty Neuro: As above  Otherwise the pertinent  positives of the ROS are noted above.    Objective:   Blood pressure 116/76, pulse 76, temperature 98.4 F (36.9 C), temperature source Oral, height 5' 7.25" (1.708 m), weight 181 lb 4 oz (82.2 kg).  Gen: Well-developed,well-nourished,in no acute distress; alert,appropriate and cooperative throughout examination HEENT: Normocephalic and atraumatic without obvious abnormalities.  Ears, externally no deformities Pulm: Breathing comfortably in no respiratory distress Range of motion at  the waist: Flexion, rotation and lateral bending: Limited to approximately 45 degrees of forward flexion.  Extension is notably limited.  Approximate 35 to 40% loss of motion in all directions otherwise.  No echymosis or edema Rises to examination table with no difficulty Gait: minimally antalgic  Inspection/Deformity: No abnormality Paraspinus T: Bilateral tenderness and tightness from L3-S1 bilaterally.  B Ankle Dorsiflexion (L5,4): 5/5 B Great Toe Dorsiflexion (L5,4): 5/5 Heel Walk (L5): WNL Toe Walk (S1): WNL Rise/Squat (L4): WNL, mild pain  SENSORY B Medial Foot (L4): WNL B Dorsum (L5): WNL B Lateral (S1): WNL Light Touch: WNL Pinprick: WNL  REFLEXES Knee (L4): 2+ Ankle (S1): 2+  B SLR, seated: neg B SLR, supine: neg B FABER: neg B Reverse FABER: neg B Greater Troch: NT B Log Roll: neg B Stork: NT B Sciatic Notch: NT  Radiology: No results found.  Assessment and Plan:   Low back pain radiating to left leg  Anatomy reviewed. Conservative algorithms for acute back pain reviewed.  Given level of discomfort, need for work return, start with steroids, rom, zanaflex at night Start with medications, core rehab, and progress from there following low back pain algorithm. No red flags are present.  Follow-up: 10-14 d if not improved  Meds ordered this encounter  Medications  . predniSONE (DELTASONE) 20 MG tablet    Sig: 2 tabs po for 5 days, then 1 tab po for 5 days    Dispense:   15 tablet    Refill:  0  . tiZANidine (ZANAFLEX) 4 MG tablet    Sig: Take 1 tablet (4 mg total) by mouth Nightly.    Dispense:  30 tablet    Refill:  1   Signed,  Arvel Oquinn T. Marsel Gail, MD   Allergies as of 11/28/2017   No Known Allergies     Medication List        Accurate as of 11/28/17 11:59 PM. Always use your most recent med list.          citalopram 20 MG tablet Commonly known as:  CELEXA TAKE 1 TABLET BY MOUTH EVERY DAY   pantoprazole 40 MG tablet Commonly known as:  PROTONIX TAKE 1 TABLET BY MOUTH EVERY DAY   predniSONE 20 MG tablet Commonly known as:  DELTASONE 2 tabs  po for 5 days, then 1 tab po for 5 days   SUMAtriptan 100 MG tablet Commonly known as:  IMITREX TAKE 1 TABLET BY MOUTH AS NEEDED FOR MIGRAINE. MAY REPEAT IN 2 HRS IF NEEDED. MAX 2TABS/DAY   tiZANidine 4 MG tablet Commonly known as:  ZANAFLEX Take 1 tablet (4 mg total) by mouth Nightly.   triamcinolone cream 0.1 % Commonly known as:  KENALOG Apply 1 application topically 2 (two) times daily as needed.

## 2017-11-29 ENCOUNTER — Encounter: Payer: Self-pay | Admitting: Family Medicine

## 2017-12-01 ENCOUNTER — Ambulatory Visit (INDEPENDENT_AMBULATORY_CARE_PROVIDER_SITE_OTHER)
Admission: RE | Admit: 2017-12-01 | Discharge: 2017-12-01 | Disposition: A | Discharge status: Home / Self Care | Payer: BLUE CROSS/BLUE SHIELD | Source: Ambulatory Visit | Attending: Family Medicine | Admitting: Family Medicine

## 2017-12-01 ENCOUNTER — Ambulatory Visit: Payer: BLUE CROSS/BLUE SHIELD | Admitting: Family Medicine

## 2017-12-01 ENCOUNTER — Encounter: Payer: Self-pay | Admitting: Family Medicine

## 2017-12-01 ENCOUNTER — Telehealth: Payer: Self-pay

## 2017-12-01 VITALS — BP 124/90 | HR 70 | Temp 98.2°F | Ht 67.25 in | Wt 180.0 lb

## 2017-12-01 DIAGNOSIS — M4807 Spinal stenosis, lumbosacral region: Secondary | ICD-10-CM | POA: Diagnosis not present

## 2017-12-01 DIAGNOSIS — M5442 Lumbago with sciatica, left side: Secondary | ICD-10-CM

## 2017-12-01 MED ORDER — TRAMADOL HCL 50 MG PO TABS: 50.0000 mg | ORAL_TABLET | Freq: Three times a day (TID) | ORAL | 0 refills | Status: DC | PRN

## 2017-12-01 MED ORDER — CYCLOBENZAPRINE HCL 10 MG PO TABS: 10.0000 mg | ORAL_TABLET | Freq: Three times a day (TID) | ORAL | 0 refills | Status: DC | PRN

## 2017-12-01 NOTE — Telephone Encounter (Signed)
He appeared non-toxic with L lumbar radiculopathy without worrisome features or neurological deficit, and it is not really realistic to think that he would be better in such a short amount of time. It appears that he is already arrived for his appointment today.   I would expect bare minimum 2-3 weeks. 3-4 days is not realistically ever the case.

## 2017-12-01 NOTE — Patient Instructions (Signed)
Continue heat on your back for 10 minutes at a time when needed  Slow walking is a good idea   Xray now - we will contact you with a result   Instead of zanaflex -try the flexeril (caution of sedation)  If pain is severe -you can try the tramadol (caution of sedation)   Use a stool softener if needed   Continue the prednisone   If symptoms suddenly worsen or if neurologic change like numbness or loss of strength please let us know   I will communicate with Dr Patsy Lager

## 2017-12-01 NOTE — Assessment & Plan Note (Signed)
Worsened acutely last night in bed -suspect spasm  Exam is reassuring today w/o neuro changes  Will try flexeril instead of zanaflex (may help sleep more) -but caution of sedation Heat/slow walking Out of work today  Xray now -pend rad review Tramadol short term for severe pain only (disc sedation and habit potential with this)  Update if not starting to improve in a week or if worsening

## 2017-12-01 NOTE — Progress Notes (Signed)
Subjective:    Patient ID: David Sheppard, male    DOB: 08/12/1965, 52 y.o.   MRN: [id number]  HPI Here for low back pain radiating down L leg   Saw Dr Patsy Lager on 9/16 for this   Muscle tightness paraspinus bilat lumbar noted Nl neuro assessment (no red flags)  tx conservatively for acute back pain - tx with prednisone 40 mg tape rand zanaflex  Also rec core rehab   Per pt Tues a little better  Wed a little worse at work /not much  Woke up at 1 am with increased pain (constant even w/o movement) - worse with movement also  Radiating to the knee (left) now - was prev to buttock  Pain scale 8-9 with movement   Pain is sharp when moving / dull ache when still  Not throbbing  Not tingling or electric  occ feels like it will go out on him  Worse with cough or sneeze   No bowel or bladder changes No numbness No foot drop   Heat helps just a little   Most comfortable flat on back   Bending forward is worse than bending back   Pt is a current smoker   Daughter is getting married Saturday   / Patient Active Problem List   Diagnosis Date Noted  . Cellulitis 08/14/2017  . Generalized anxiety disorder 07/30/2011  . Panic attacks 07/30/2011  . Left low back pain 07/19/2008  . OTHER MALAISE AND FATIGUE 06/24/2008  . GERD 06/21/2008  . MIGRAINE HEADACHE 06/04/2008  . ALLERGIC RHINITIS 06/04/2008   Past Medical History:  Diagnosis Date  . Allergy   . Generalized anxiety disorder 07/30/2011  . GERD (gastroesophageal reflux disease)   . Migraine   . Panic attacks 07/30/2011   Past Surgical History:  Procedure Laterality Date  . HERNIA REPAIR    . TONSILLECTOMY AND ADENOIDECTOMY     Social History   Tobacco Use  . Smoking status: Current Every Day Smoker    Packs/day: 0.50    Types: Cigarettes  . Smokeless tobacco: Former Engineer, water Use Topics  . Alcohol use: Yes    Alcohol/week: 0.0 standard drinks    Comment: occ  . Drug use: No   Family History    Problem Relation Age of Onset  . Hypertension Father   . Hyperlipidemia Paternal Grandmother   . Diabetes Paternal Grandmother   . Heart disease Paternal Grandfather   . Hyperlipidemia Paternal Grandfather   . Diabetes Paternal Grandfather    No Known Allergies Current Outpatient Medications on File Prior to Visit  Medication Sig Dispense Refill  . citalopram (CELEXA) 20 MG tablet TAKE 1 TABLET BY MOUTH EVERY DAY 90 tablet 1  . pantoprazole (PROTONIX) 40 MG tablet TAKE 1 TABLET BY MOUTH EVERY DAY 90 tablet 2  . predniSONE (DELTASONE) 20 MG tablet 2 tabs po for 5 days, then 1 tab po for 5 days 15 tablet 0  . SUMAtriptan (IMITREX) 100 MG tablet TAKE 1 TABLET BY MOUTH AS NEEDED FOR MIGRAINE. MAY REPEAT IN 2 HRS IF NEEDED. MAX 2TABS/DAY 9 tablet 5  . tiZANidine (ZANAFLEX) 4 MG tablet Take 1 tablet (4 mg total) by mouth Nightly. 30 tablet 1  . triamcinolone cream (KENALOG) 0.1 % Apply 1 application topically 2 (two) times daily as needed. 30 g 0   No current facility-administered medications on file prior to visit.    Review of Systems  Constitutional: Negative for activity change, appetite change, fatigue,  fever and unexpected weight change.  HENT: Negative for congestion, rhinorrhea, sore throat and trouble swallowing.   Eyes: Negative for pain, redness, itching and visual disturbance.  Respiratory: Negative for cough, chest tightness, shortness of breath and wheezing.   Cardiovascular: Negative for chest pain and palpitations.  Gastrointestinal: Negative for abdominal pain, blood in stool, constipation, diarrhea and nausea.  Endocrine: Negative for cold intolerance, heat intolerance, polydipsia and polyuria.  Genitourinary: Negative for difficulty urinating, dysuria, frequency and urgency.  Musculoskeletal: Positive for back pain. Negative for arthralgias, joint swelling and myalgias.  Skin: Negative for pallor and rash.  Neurological: Negative for dizziness, tremors, weakness,  light-headedness, numbness and headaches.  Hematological: Negative for adenopathy. Does not bruise/bleed easily.  Psychiatric/Behavioral: Negative for decreased concentration and dysphoric mood. The patient is not nervous/anxious.        Objective:   Physical Exam  Constitutional: He appears well-developed and well-nourished. No distress.  HENT:  Head: Normocephalic and atraumatic.  Eyes: Pupils are equal, round, and reactive to light. Conjunctivae and EOM are normal. No scleral icterus.  Neck: Normal range of motion. Neck supple.  Cardiovascular: Normal rate and regular rhythm.  Pulmonary/Chest: Effort normal and breath sounds normal. He has no wheezes. He has no rales.  Abdominal: Soft. Bowel sounds are normal. He exhibits no distension. There is no tenderness.  Musculoskeletal: He exhibits tenderness.       Right shoulder: He exhibits decreased range of motion, tenderness and spasm. He exhibits no bony tenderness, no swelling, no crepitus, no deformity, normal pulse and normal strength.       Lumbar back: He exhibits decreased range of motion, tenderness and spasm. He exhibits no bony tenderness and no edema.  Tender in L lumbar musculature  No bony tenderness  No rom hips  Spasm palp on L  Pain to flex over 30 deg and also L lateral bend  Gait is steady w/o foot drop  No neuro changes   Lymphadenopathy:    He has no cervical adenopathy.  Neurological: He is alert. He has normal strength and normal reflexes. He displays no atrophy. No cranial nerve deficit or sensory deficit. He exhibits normal muscle tone. Coordination normal.  Pos SLR for L buttock pain   Skin: Skin is warm and dry. No rash noted. No erythema. No pallor.  Psychiatric: He has a normal mood and affect.          Assessment & Plan:   Problem List Items Addressed This Visit      Other   Left low back pain - Primary    Worsened acutely last night in bed -suspect spasm  Exam is reassuring today w/o neuro  changes  Will try flexeril instead of zanaflex (may help sleep more) -but caution of sedation Heat/slow walking Out of work today  Xray now -pend rad review Tramadol short term for severe pain only (disc sedation and habit potential with this)  Update if not starting to improve in a week or if worsening         Relevant Medications   cyclobenzaprine (FLEXERIL) 10 MG tablet   traMADol (ULTRAM) 50 MG tablet   Other Relevant Orders   DG Lumbar Spine Complete

## 2017-12-01 NOTE — Telephone Encounter (Signed)
Copied from CRM [id number]. Topic: General - Other >> Dec 01, 2017  8:13 AM Gaynelle Adu wrote: Reason for CRM: Patient is calling to advise he is still having back pain he came in on Monday to see Dr.copland. He is requesting a  call back. He is wanting to come in and be seen today. Please advise >> Dec 01, 2017 10:44 AM Patience Musca, LPN wrote: I spoke with pt; pt taking prednisone and tizanidine and thought was getting better but last night pain in back was worse and woke pt up; pain radiates down lt leg. Pt request appt today; pt to see Dr Milinda Antis 12/01/17 at 11:45. FYI to Dr Milinda Antis.

## 2017-12-01 NOTE — Telephone Encounter (Signed)
I will see him then  Cc to PCP so he is aware

## 2018-02-14 NOTE — Telephone Encounter (Signed)
Pt saw Dr Milinda Antis on 12/01/17.

## 2018-03-15 ENCOUNTER — Encounter: Payer: Self-pay | Admitting: Emergency Medicine

## 2018-03-15 ENCOUNTER — Inpatient Hospital Stay
Admission: EM | Admit: 2018-03-15 | Discharge: 2018-03-18 | DRG: 418 | Disposition: A | Discharge status: Home / Self Care | Payer: BLUE CROSS/BLUE SHIELD | Attending: Internal Medicine | Admitting: Internal Medicine

## 2018-03-15 ENCOUNTER — Other Ambulatory Visit: Payer: Self-pay

## 2018-03-15 ENCOUNTER — Emergency Department: Payer: BLUE CROSS/BLUE SHIELD

## 2018-03-15 DIAGNOSIS — R1013 Epigastric pain: Secondary | ICD-10-CM | POA: Diagnosis not present

## 2018-03-15 DIAGNOSIS — R079 Chest pain, unspecified: Secondary | ICD-10-CM | POA: Diagnosis not present

## 2018-03-15 DIAGNOSIS — R109 Unspecified abdominal pain: Secondary | ICD-10-CM | POA: Diagnosis not present

## 2018-03-15 DIAGNOSIS — F411 Generalized anxiety disorder: Secondary | ICD-10-CM | POA: Diagnosis present

## 2018-03-15 DIAGNOSIS — R110 Nausea: Secondary | ICD-10-CM | POA: Diagnosis not present

## 2018-03-15 DIAGNOSIS — Z833 Family history of diabetes mellitus: Secondary | ICD-10-CM | POA: Diagnosis not present

## 2018-03-15 DIAGNOSIS — K8080 Other cholelithiasis without obstruction: Secondary | ICD-10-CM | POA: Diagnosis not present

## 2018-03-15 DIAGNOSIS — K8062 Calculus of gallbladder and bile duct with acute cholecystitis without obstruction: Secondary | ICD-10-CM | POA: Diagnosis present

## 2018-03-15 DIAGNOSIS — Z8711 Personal history of peptic ulcer disease: Secondary | ICD-10-CM | POA: Diagnosis not present

## 2018-03-15 DIAGNOSIS — Z79899 Other long term (current) drug therapy: Secondary | ICD-10-CM

## 2018-03-15 DIAGNOSIS — G43909 Migraine, unspecified, not intractable, without status migrainosus: Secondary | ICD-10-CM | POA: Diagnosis not present

## 2018-03-15 DIAGNOSIS — K851 Biliary acute pancreatitis without necrosis or infection: Secondary | ICD-10-CM | POA: Diagnosis not present

## 2018-03-15 DIAGNOSIS — K219 Gastro-esophageal reflux disease without esophagitis: Secondary | ICD-10-CM | POA: Diagnosis not present

## 2018-03-15 DIAGNOSIS — K8020 Calculus of gallbladder without cholecystitis without obstruction: Secondary | ICD-10-CM | POA: Diagnosis not present

## 2018-03-15 DIAGNOSIS — F410 Panic disorder [episodic paroxysmal anxiety] without agoraphobia: Secondary | ICD-10-CM | POA: Diagnosis present

## 2018-03-15 DIAGNOSIS — K831 Obstruction of bile duct: Secondary | ICD-10-CM | POA: Diagnosis not present

## 2018-03-15 DIAGNOSIS — K812 Acute cholecystitis with chronic cholecystitis: Secondary | ICD-10-CM | POA: Diagnosis not present

## 2018-03-15 DIAGNOSIS — R945 Abnormal results of liver function studies: Secondary | ICD-10-CM | POA: Diagnosis not present

## 2018-03-15 DIAGNOSIS — R935 Abnormal findings on diagnostic imaging of other abdominal regions, including retroperitoneum: Secondary | ICD-10-CM | POA: Diagnosis not present

## 2018-03-15 DIAGNOSIS — F419 Anxiety disorder, unspecified: Secondary | ICD-10-CM | POA: Diagnosis not present

## 2018-03-15 DIAGNOSIS — F17210 Nicotine dependence, cigarettes, uncomplicated: Secondary | ICD-10-CM | POA: Diagnosis present

## 2018-03-15 DIAGNOSIS — K810 Acute cholecystitis: Secondary | ICD-10-CM | POA: Diagnosis not present

## 2018-03-15 DIAGNOSIS — R748 Abnormal levels of other serum enzymes: Secondary | ICD-10-CM

## 2018-03-15 DIAGNOSIS — Z8249 Family history of ischemic heart disease and other diseases of the circulatory system: Secondary | ICD-10-CM | POA: Diagnosis not present

## 2018-03-15 DIAGNOSIS — K8689 Other specified diseases of pancreas: Secondary | ICD-10-CM | POA: Diagnosis not present

## 2018-03-15 LAB — TROPONIN I
Troponin I: <0.03 ng/mL (ref <0.03)
Troponin I: <0.03 ng/mL
Troponin I: <0.03 ng/mL

## 2018-03-15 LAB — BASIC METABOLIC PANEL
ANION GAP: 7 (ref 5–15)
BUN: 13 mg/dL (ref 6–20)
CO2: 26 mmol/L (ref 22–32)
Calcium: 8.9 mg/dL (ref 8.9–10.3)
Chloride: 106 mmol/L (ref 98–111)
Creatinine, Ser: 0.89 mg/dL (ref 0.61–1.24)
GFR calc Af Amer: >60 mL/min (ref >60)
GFR calc non Af Amer: >60 mL/min (ref >60)
Glucose, Bld: 97 mg/dL (ref 70–99)
Potassium: 3.9 mmol/L (ref 3.5–5.1)
Sodium: 139 mmol/L (ref 135–145)

## 2018-03-15 LAB — CBC
HCT: 46 % (ref 39.0–52.0)
HEMOGLOBIN: 15.6 g/dL (ref 13.0–17.0)
MCH: 32.2 pg (ref 26.0–34.0)
MCHC: 33.9 g/dL (ref 30.0–36.0)
MCV: 94.8 fL (ref 80.0–100.0)
Platelets: 183 10*3/uL (ref 150–400)
RBC: 4.85 MIL/uL (ref 4.22–5.81)
RDW: 12 % (ref 11.5–15.5)
WBC: 7.4 10*3/uL (ref 4.0–10.5)
nRBC: 0 % (ref 0.0–0.2)

## 2018-03-15 LAB — HEPATIC FUNCTION PANEL
ALT: 432 U/L — ABNORMAL HIGH (ref 0–44)
AST: 567 U/L — ABNORMAL HIGH (ref 15–41)
Albumin: 4.3 g/dL (ref 3.5–5.0)
Alkaline Phosphatase: 134 U/L — ABNORMAL HIGH (ref 38–126)
Bilirubin, Direct: 1.8 mg/dL — ABNORMAL HIGH (ref 0.0–0.2)
Indirect Bilirubin: 1.4 mg/dL — ABNORMAL HIGH (ref 0.3–0.9)
Total Bilirubin: 3.2 mg/dL — ABNORMAL HIGH (ref 0.3–1.2)
Total Protein: 7.1 g/dL (ref 6.5–8.1)

## 2018-03-15 MED ORDER — ONDANSETRON HCL 4 MG PO TABS: 4.0000 mg | ORAL_TABLET | Freq: Four times a day (QID) | ORAL | Status: DC | PRN

## 2018-03-15 MED ORDER — ACETAMINOPHEN 650 MG RE SUPP: 650.0000 mg | Freq: Four times a day (QID) | RECTAL | Status: DC | PRN

## 2018-03-15 MED ORDER — OXYCODONE HCL 5 MG PO TABS
5.0000 mg | ORAL_TABLET | ORAL | Status: DC | PRN
  Administered 2018-03-15 – 2018-03-16 (×3): 5 mg via ORAL
  Filled 2018-03-15 (×3): qty 1

## 2018-03-15 MED ORDER — ALUM & MAG HYDROXIDE-SIMETH 200-200-20 MG/5ML PO SUSP
30.0000 mL | ORAL | Status: DC | PRN
  Administered 2018-03-15: 30 mL via ORAL
  Filled 2018-03-15: qty 30

## 2018-03-15 MED ORDER — MORPHINE SULFATE (PF) 4 MG/ML IV SOLN
4.0000 mg | Freq: Once | INTRAVENOUS | Status: AC
  Administered 2018-03-15: 4 mg via INTRAVENOUS
  Filled 2018-03-15: qty 1

## 2018-03-15 MED ORDER — LIDOCAINE VISCOUS HCL 2 % MT SOLN
15.0000 mL | Freq: Once | OROMUCOSAL | Status: AC
  Administered 2018-03-15: 15 mL via ORAL
  Filled 2018-03-15: qty 15

## 2018-03-15 MED ORDER — ACETAMINOPHEN 325 MG PO TABS: 650.0000 mg | ORAL_TABLET | Freq: Four times a day (QID) | ORAL | Status: DC | PRN

## 2018-03-15 MED ORDER — ONDANSETRON HCL 4 MG/2ML IJ SOLN
4.0000 mg | Freq: Four times a day (QID) | INTRAMUSCULAR | Status: DC | PRN
  Administered 2018-03-17: 4 mg via INTRAVENOUS

## 2018-03-15 MED ORDER — CITALOPRAM HYDROBROMIDE 20 MG PO TABS: 20.0000 mg | ORAL_TABLET | Freq: Every day | ORAL | Status: DC

## 2018-03-15 MED ORDER — ALUM & MAG HYDROXIDE-SIMETH 200-200-20 MG/5ML PO SUSP
30.0000 mL | Freq: Once | ORAL | Status: AC
  Administered 2018-03-15: 30 mL via ORAL
  Filled 2018-03-15: qty 30

## 2018-03-15 MED ORDER — NICOTINE 14 MG/24HR TD PT24
14.0000 mg | MEDICATED_PATCH | Freq: Every day | TRANSDERMAL | Status: DC | PRN
  Administered 2018-03-15: 14 mg via TRANSDERMAL
  Filled 2018-03-15: qty 1

## 2018-03-15 MED ORDER — PANTOPRAZOLE SODIUM 40 MG IV SOLR
40.0000 mg | Freq: Once | INTRAVENOUS | Status: AC
  Administered 2018-03-15: 40 mg via INTRAVENOUS
  Filled 2018-03-15: qty 40

## 2018-03-15 MED ORDER — ONDANSETRON HCL 4 MG/2ML IJ SOLN
4.0000 mg | Freq: Once | INTRAMUSCULAR | Status: AC
  Administered 2018-03-15: 4 mg via INTRAVENOUS
  Filled 2018-03-15: qty 2

## 2018-03-15 MED ORDER — PANTOPRAZOLE SODIUM 40 MG IV SOLR
40.0000 mg | Freq: Two times a day (BID) | INTRAVENOUS | Status: DC
  Administered 2018-03-15 – 2018-03-17 (×5): 40 mg via INTRAVENOUS
  Filled 2018-03-15 (×6): qty 40

## 2018-03-15 NOTE — ED Triage Notes (Signed)
Here for central chest pain that has been intermittent mostly at night for last 1-2 weeks. Today started during day. Described as heavy. Some nausea but no other symptoms. VSS. No fevers. Ambulatory. Unlabored. Color WNL

## 2018-03-15 NOTE — ED Notes (Signed)
meds given for sudden onset of nausea along with increased epigastric pain rated nat 10/10. Will monitor. Patient awaiting repeat/ different view exray. Family at bedside. Call light w/i reach.

## 2018-03-15 NOTE — ED Notes (Signed)
Report given to receiving  Nurse patient up to [facility]

## 2018-03-15 NOTE — ED Notes (Signed)
Patient reports hx of ulcer states cp for over 1 week, pain increases specifically at night, when he lays flat. Denies radiation. Denies sob/n/v/f/c.

## 2018-03-15 NOTE — ED Notes (Signed)
GI cocktail ordered and administered.

## 2018-03-15 NOTE — ED Provider Notes (Signed)
[facility] Emergency Department Provider Note   ____________________________________________    I have reviewed the triage vital signs and the nursing notes.   HISTORY  Chief Complaint Chest Pain     HPI David Sheppard is a 53 y.o. male who presents with complaints of epigastric and chest pain.  Patient reports over the last several days he has woken up several times in overnight diaphoretic with severe lower chest, upper epigastric pain.  Denies a history of heart disease, he does smoke cigarettes.  No fevers or chills.  No cough or shortness of breath.  No pleurisy.  No calf pain or swelling.  Has not taking thing for this.  Typically symptoms improve however today they have not improved   Past Medical History:  Diagnosis Date  . Allergy   . Generalized anxiety disorder 07/30/2011  . GERD (gastroesophageal reflux disease)   . Migraine   . Panic attacks 07/30/2011    Patient Active Problem List   Diagnosis Date Noted  . Cellulitis 08/14/2017  . Generalized anxiety disorder 07/30/2011  . Panic attacks 07/30/2011  . Left low back pain 07/19/2008  . OTHER MALAISE AND FATIGUE 06/24/2008  . GERD 06/21/2008  . MIGRAINE HEADACHE 06/04/2008  . ALLERGIC RHINITIS 06/04/2008    Past Surgical History:  Procedure Laterality Date  . HERNIA REPAIR    . TONSILLECTOMY AND ADENOIDECTOMY      Prior to Admission medications   Medication Sig Start Date End Date Taking? Authorizing Provider  citalopram (CELEXA) 20 MG tablet TAKE 1 TABLET BY MOUTH EVERY DAY 09/26/17  Yes Copland, Karleen HampshireSpencer, MD  pantoprazole (PROTONIX) 40 MG tablet TAKE 1 TABLET BY MOUTH EVERY DAY 09/26/17  Yes Copland, Karleen HampshireSpencer, MD  SUMAtriptan (IMITREX) 100 MG tablet TAKE 1 TABLET BY MOUTH AS NEEDED FOR MIGRAINE. MAY REPEAT IN 2 HRS IF NEEDED. MAX 2TABS/DAY 08/16/17  Yes Copland, Karleen HampshireSpencer, MD     Allergies Patient has no known allergies.  Family History  Problem Relation Age of Onset  .  Hypertension Father   . Heart disease Paternal Grandfather   . Hyperlipidemia Paternal Grandfather   . Diabetes Paternal Grandfather   . Hyperlipidemia Paternal Grandmother   . Diabetes Paternal Grandmother     Social History Social History   Tobacco Use  . Smoking status: Current Every Day Smoker    Packs/day: 0.50    Types: Cigarettes  . Smokeless tobacco: Former Engineer, waterUser  Substance Use Topics  . Alcohol use: Yes    Alcohol/week: 0.0 standard drinks    Comment: occ  . Drug use: No    Review of Systems  Constitutional: No fever/chills Eyes: No visual changes.  ENT: No sore throat. Cardiovascular: As above Respiratory: Denies shortness of breath. Gastrointestinal: As above Genitourinary: Negative for dysuria. Musculoskeletal: Negative for back pain. Skin: Negative for rash. Neurological: Negative for headaches or weakness   ____________________________________________   PHYSICAL EXAM:  VITAL SIGNS: ED Triage Vitals [03/15/18 1038]  Enc Vitals Group     BP (!) 144/85     Pulse Rate 85     Resp 14     Temp 98.2 F (36.8 C)     Temp Source Oral     SpO2 98 %     Weight 81.6 kg (180 lb)     Height 1.727 m (5\' 8" )     Head Circumference      Peak Flow      Pain Score 5     Pain  Loc      Pain Edu?      Excl. in GC?     Constitutional: Alert and oriented.  Eyes: Conjunctivae are normal.   Nose: No congestion/rhinnorhea. Mouth/Throat: Mucous membranes are moist.    Cardiovascular: Normal rate, regular rhythm. Grossly normal heart sounds.  Good peripheral circulation. Respiratory: Normal respiratory effort.   Gastrointestinal: Soft and nontender. No distention.   Musculoskeletal: No lower extremity tenderness nor edema.  Warm and well perfused Neurologic:  Normal speech and language. No gross focal neurologic deficits are appreciated.  Skin:  Skin is warm, dry and intact. No rash noted. Psychiatric: Mood and affect are normal. Speech and behavior are  normal.  ____________________________________________   LABS (all labs ordered are listed, but only abnormal results are displayed)  Labs Reviewed  BASIC METABOLIC PANEL  CBC  TROPONIN I  TROPONIN I  TROPONIN I   ____________________________________________  EKG  ED ECG REPORT I, Jene Every, the attending physician, personally viewed and interpreted this ECG.  Date: 03/15/2018  Rhythm: normal sinus rhythm QRS Axis: normal Intervals: normal ST/T Wave abnormalities: normal Narrative Interpretation: no evidence of acute ischemia  ____________________________________________  RADIOLOGY  Chest x-ray unremarkable ____________________________________________   PROCEDURES  Procedure(s) performed: No  Procedures   Critical Care performed: No ____________________________________________   INITIAL IMPRESSION / ASSESSMENT AND PLAN / ED COURSE  Pertinent labs & imaging results that were available during my care of the patient were reviewed by me and considered in my medical decision making (see chart for details).  Patient presents with epigastric, lower chest pain.  Diaphoretic as noted above, EKG, troponin normal.  Unclear whether this is ACS concerning hesitation with diaphoresis.  Possibly gastritis.  Given GI cocktail but symptoms became acutely worse.  Will discuss with hospitalist for admission    ____________________________________________   FINAL CLINICAL IMPRESSION(S) / ED DIAGNOSES  Final diagnoses:  Chest pain, unspecified type        Note:  This document was prepared using Dragon voice recognition software and may include unintentional dictation errors.   Jene Every, MD 03/15/18 [phone]

## 2018-03-15 NOTE — ED Notes (Signed)
Nurse provided pt emesis bag. Pt's nurse notified of pt's emesis.

## 2018-03-15 NOTE — ED Notes (Signed)
ED TO INPATIENT HANDOFF REPORT  Name/Age/Gender David SloughJeffrey V Sheppard 53 y.o. male  Code Status    Code Status Orders  (From admission, onward)         Start     Ordered   03/15/18 1547  Full code  Continuous     03/15/18 1547        Code Status History    This patient has a current code status but no historical code status.      Home/SNF/Other From home   Chief Complaint chest pain  Level of Care/Admitting Diagnosis ED Disposition    ED Disposition Condition Comment   Admit  [facility] [[id number]]  Level of Care: Med-Surg [16]  Diagnosis: Epigastric pain [214842]  Admitting Physician: Alford HighlandWIETING, RICHARD [[id number]]  Attending Physician: Alford HighlandWIETING, RICHARD [phone][[id number]]  PT Class (Do Not Modify): Observation [104]  PT Acc Code (Do Not Modify): Observation [10022]       Medical History Past Medical History:  Diagnosis Date  . Allergy   . Generalized anxiety disorder 07/30/2011  . GERD (gastroesophageal reflux disease)   . Migraine   . Panic attacks 07/30/2011    Allergies No Known Allergies  IV Location/Drains/Wounds Patient Lines/Drains/Airways Status   Active Line/Drains/Airways    Name:   Placement date:   Placement time:   Site:   Days:   Peripheral IV 03/15/18 Right Antecubital   03/15/18    1341    Antecubital   less than 1          Labs/Imaging Results for orders placed or performed during the hospital encounter of 03/15/18 (from the past 48 hour(s))  Basic metabolic panel     Status: None   Collection Time: 03/15/18 10:44 AM  Result Value Ref Range   Sodium 139 135 - 145 mmol/L   Potassium 3.9 3.5 - 5.1 mmol/L   Chloride 106 98 - 111 mmol/L   CO2 26 22 - 32 mmol/L   Glucose, Bld 97 70 - 99 mg/dL   BUN 13 6 - 20 mg/dL   Creatinine, Ser 1.300.89 0.61 - 1.24 mg/dL   Calcium 8.9 8.9 - 86.510.3 mg/dL   GFR calc non Af Amer >60 >60 mL/min   GFR calc Af Amer >60 >60 mL/min   Anion gap 7 5 - 15    Comment: Performed at  [facility], [address] [place], [place] [address]  CBC     Status: None   Collection Time: 03/15/18 10:44 AM  Result Value Ref Range   WBC 7.4 4.0 - 10.5 K/uL   RBC 4.85 4.22 - 5.81 MIL/uL   Hemoglobin 15.6 13.0 - 17.0 g/dL   HCT 62.946.0 52.839.0 - 41.352.0 %   MCV 94.8 80.0 - 100.0 fL   MCH 32.2 26.0 - 34.0 pg   MCHC 33.9 30.0 - 36.0 g/dL   RDW 24.412.0 01.011.5 - 27.215.5 %   Platelets 183 150 - 400 K/uL   nRBC 0.0 0.0 - 0.2 %    Comment: Performed at [facility], [address] [place], [place] [address]  Troponin I - ONCE - STAT     Status: None   Collection Time: 03/15/18 10:44 AM  Result Value Ref Range   Troponin I <0.03 <0.03 ng/mL    Comment: Performed at [facility], [address] [place], [place] [address]  Troponin I - Once     Status: None   Collection Time: 03/15/18  4:03 PM  Result Value Ref Range   Troponin I <0.03 <0.03 ng/mL    Comment: Performed at [facility], [address] [place] [place], [place] [address]   Dg Chest 2 View  Result Date: 03/15/2018 CLINICAL DATA:  53 year old male with history of central chest pain intermittently for the past 1-2 weeks. EXAM: CHEST - 2 VIEW COMPARISON:  None. FINDINGS: Lung volumes are normal. No consolidative airspace disease. No pleural effusions. No pneumothorax. No pulmonary nodule or mass noted. Pulmonary vasculature and the cardiomediastinal silhouette are within normal limits. IMPRESSION: No radiographic evidence of acute cardiopulmonary disease. Electronically Signed   By: Trudie Reed M.D.   On: 03/15/2018 11:28   Dg Abd 2 Views  Result Date: 03/15/2018 CLINICAL DATA:  Acute epigastric abdominal pain. EXAM: ABDOMEN - 2 VIEW COMPARISON:  None. FINDINGS: The bowel gas pattern is normal. There is no evidence of free air. No radio-opaque calculi or other significant radiographic abnormality is seen. IMPRESSION: No evidence of bowel obstruction or ileus. Electronically Signed   By: Lupita Raider,  M.D.   On: 03/15/2018 14:56    Pending Labs Unresulted Labs (From admission, onward)    Start     Ordered   03/16/18 0500  Basic metabolic panel  Tomorrow morning,   STAT     03/15/18 1547   03/16/18 0500  CBC  Tomorrow morning,   STAT     03/15/18 1547   03/15/18 1911  Troponin I - Once  Once,   STAT     03/15/18 1510   03/15/18 1557  Hepatic function panel  Add-on,   AD     03/15/18 1556   03/15/18 1548  HIV antibody (Routine Testing)  Add-on,   AD     03/15/18 1547   03/15/18 1548  Occult blood card to lab, stool RN will collect  Once,   STAT    Question:  Specimen to be collected by?  Answer:  RN will collect   03/15/18 1547          Vitals/Pain Today's Vitals   03/15/18 1415 03/15/18 1430 03/15/18 1500 03/15/18 1507  BP:  122/81 123/81   Pulse: (!) 111  97   Resp: 20 13 13    Temp:      TempSrc:      SpO2: 97%  96%   Weight:      Height:      PainSc:    2     Isolation Precautions No active isolations  Medications Medications  pantoprazole (PROTONIX) injection 40 mg (has no administration in time range)  acetaminophen (TYLENOL) tablet 650 mg (has no administration in time range)    Or  acetaminophen (TYLENOL) suppository 650 mg (has no administration in time range)  ondansetron (ZOFRAN) tablet 4 mg (has no administration in time range)    Or  ondansetron (ZOFRAN) injection 4 mg (has no administration in time range)  oxyCODONE (Oxy IR/ROXICODONE) immediate release tablet 5 mg (has no administration in time range)  citalopram (CELEXA) tablet 20 mg (has no administration in time range)  nicotine (NICODERM CQ - dosed in mg/24 hours) patch 14 mg (has no administration in time range)  alum & mag hydroxide-simeth (MAALOX/MYLANTA) 200-200-20 MG/5ML suspension 30 mL (30 mLs Oral Given 03/15/18 1356)    And  lidocaine (XYLOCAINE) 2 % viscous mouth solution 15 mL (15 mLs Oral Given 03/15/18 1355)  morphine 4 MG/ML injection 4 mg (4 mg Intravenous Given 03/15/18 1414)   ondansetron (ZOFRAN) injection 4  mg (4 mg Intravenous Given 03/15/18 1414)  pantoprazole (PROTONIX) injection 40 mg (40 mg Intravenous Given 03/15/18 1510)    Mobility ambulatory

## 2018-03-15 NOTE — H&P (Signed)
[facility] - [place] at [facility]   PATIENT NAME: David Sheppard    MR#:  [id number]  DATE OF BIRTH:  05/29/1965  DATE OF ADMISSION:  03/15/2018  PRIMARY CARE PHYSICIAN: Hannah Beat, MD   REQUESTING/REFERRING PHYSICIAN:   CHIEF COMPLAINT:   Chief Complaint  Patient presents with  . Chest Pain    HISTORY OF PRESENT ILLNESS:  David Sheppard  is a 53 y.o. male coming in with epigastric pain.  Patient states that he has been waking up at 2:58 AM every night for the past 1 and half weeks with epigastric pain.  He can come back to sleep.  He is taking Pepto-Bismol and Mylanta.  They last about 2 to 3 hours and then settles down where he is able to go back to sleep a little bit.  In the morning he is better.  No relation to food.  He has been taking Goody powder.  No relation to exercise.  He does have some sweating with this pain.  Patient is getting over a cold. in the ER his first cardiac enzyme is negative and hospitalist services were contacted for evaluation.  Patient nausea and vomiting in the ER after GI cocktail.  PAST MEDICAL HISTORY:   Past Medical History:  Diagnosis Date  . Allergy   . Generalized anxiety disorder 07/30/2011  . GERD (gastroesophageal reflux disease)   . Migraine   . Panic attacks 07/30/2011    PAST SURGICAL HISTORY:   Past Surgical History:  Procedure Laterality Date  . HERNIA REPAIR    . TONSILLECTOMY AND ADENOIDECTOMY      SOCIAL HISTORY:   Social History   Tobacco Use  . Smoking status: Current Every Day Smoker    Packs/day: 0.50    Types: Cigarettes  . Smokeless tobacco: Former Engineer, water Use Topics  . Alcohol use: Yes    Alcohol/week: 0.0 standard drinks    Comment: occ    FAMILY HISTORY:   Family History  Problem Relation Age of Onset  . Hypertension Father   . Heart disease Paternal Grandfather   . Hyperlipidemia Paternal Grandfather   . Diabetes Paternal Grandfather   . Hyperlipidemia  Paternal Grandmother   . Diabetes Paternal Grandmother     DRUG ALLERGIES:  No Known Allergies  REVIEW OF SYSTEMS:  CONSTITUTIONAL: No fever, positive for sweating with this pain.  Positive for fatigue.  EYES: No blurred or double vision.  Wears glasses. EARS, NOSE, AND THROAT: No tinnitus or ear pain. No sore throat.  RESPIRATORY: Slight cough.  No shortness of breath, wheezing or hemoptysis.  CARDIOVASCULAR: Some lower chest pain.  No orthopnea, edema.  GASTROINTESTINAL: Positive for nausea, vomiting.1 episode of diarrhea today.  Positive for epigastric abdominal pain. No blood in bowel movements.  No black stools. GENITOURINARY: No dysuria, hematuria.  ENDOCRINE: No polyuria, nocturia,  HEMATOLOGY: No anemia, easy bruising or bleeding SKIN: No rash or lesion. MUSCULOSKELETAL: No joint pain or arthritis.   NEUROLOGIC: No tingling, numbness, weakness.  PSYCHIATRY: No anxiety or depression.   MEDICATIONS AT HOME:   Prior to Admission medications   Medication Sig Start Date End Date Taking? Authorizing Provider  citalopram (CELEXA) 20 MG tablet TAKE 1 TABLET BY MOUTH EVERY DAY 09/26/17  Yes Copland, Karleen Hampshire, MD  pantoprazole (PROTONIX) 40 MG tablet TAKE 1 TABLET BY MOUTH EVERY DAY 09/26/17  Yes Copland, Karleen Hampshire, MD  SUMAtriptan (IMITREX) 100 MG tablet TAKE 1 TABLET BY MOUTH AS NEEDED FOR MIGRAINE. MAY REPEAT IN  2 HRS IF NEEDED. MAX 2TABS/DAY 08/16/17  Yes Copland, Karleen HampshireSpencer, MD      VITAL SIGNS:  Blood pressure 123/81, pulse 97, temperature 98.6 F (37 C), temperature source Oral, resp. rate 13, height 5\' 8"  (1.727 m), weight 81.6 kg, SpO2 96 %.  PHYSICAL EXAMINATION:  GENERAL:  53 y.o.-year-old patient lying in the bed with no acute distress.  EYES: Pupils equal, round, reactive to light and accommodation. No scleral icterus. Extraocular muscles intact.  HEENT: Head atraumatic, normocephalic. Oropharynx and nasopharynx clear.  NECK:  Supple, no jugular venous distention. No thyroid  enlargement, no tenderness.  LUNGS: Normal breath sounds bilaterally, no wheezing, rales,rhonchi or crepitation. No use of accessory muscles of respiration.  CARDIOVASCULAR: S1, S2 normal. No murmurs, rubs, or gallops.  ABDOMEN: Soft, positive epigastric abdominal tenderness, nondistended. Bowel sounds present. No organomegaly or mass.  EXTREMITIES: No pedal edema, cyanosis, or clubbing.  NEUROLOGIC: Cranial nerves II through XII are intact. Muscle strength 5/5 in all extremities. Sensation intact. Gait not checked.  PSYCHIATRIC: The patient is alert and oriented x 3.  SKIN: No rash, lesion, or ulcer.   LABORATORY PANEL:   CBC Recent Labs  Lab 03/15/18 1044  WBC 7.4  HGB 15.6  HCT 46.0  PLT 183   ------------------------------------------------------------------------------------------------------------------  Chemistries  Recent Labs  Lab 03/15/18 1044  NA 139  K 3.9  CL 106  CO2 26  GLUCOSE 97  BUN 13  CREATININE 0.89  CALCIUM 8.9   ------------------------------------------------------------------------------------------------------------------  Cardiac Enzymes Recent Labs  Lab 03/15/18 1044  TROPONINI <0.03   ------------------------------------------------------------------------------------------------------------------  RADIOLOGY:  Dg Chest 2 View  Result Date: 03/15/2018 CLINICAL DATA:  53 year old male with history of central chest pain intermittently for the past 1-2 weeks. EXAM: CHEST - 2 VIEW COMPARISON:  None. FINDINGS: Lung volumes are normal. No consolidative airspace disease. No pleural effusions. No pneumothorax. No pulmonary nodule or mass noted. Pulmonary vasculature and the cardiomediastinal silhouette are within normal limits. IMPRESSION: No radiographic evidence of acute cardiopulmonary disease. Electronically Signed   By: Trudie Reedaniel  Entrikin M.D.   On: 03/15/2018 11:28   Dg Abd 2 Views  Result Date: 03/15/2018 CLINICAL DATA:  Acute epigastric  abdominal pain. EXAM: ABDOMEN - 2 VIEW COMPARISON:  None. FINDINGS: The bowel gas pattern is normal. There is no evidence of free air. No radio-opaque calculi or other significant radiographic abnormality is seen. IMPRESSION: No evidence of bowel obstruction or ileus. Electronically Signed   By: Lupita RaiderJames  Green Jr, M.D.   On: 03/15/2018 14:56    EKG:   Normal sinus rhythm no acute ST-T wave changes  IMPRESSION AND PLAN:   1.  Epigastric abdominal pain.  Patient takes Goody powder at home.  Worsened after taking a Imitrex for headache this morning.  Patient has a history of bleeding ulcers a few years back.  Observe overnight.  Get serial cardiac enzymes.  Placed on IV Protonix.  GI consultation for endoscopy tomorrow.  I epic haiku'd Dr. Maximino Greenlandahiliani of consultation.  Add on liver function test. 2.  Anxiety on Celexa 3.  GERD already on oral Protonix at home.  All the records are reviewed and case discussed with ED provider. Management plans discussed with the patient, family and they are in agreement.  CODE STATUS: Full Code  TOTAL TIME TAKING CARE OF THIS PATIENT: 50 minutes.    Alford Highlandichard Brandy Zuba M.D on 03/15/2018 at 3:49 PM  Between 7am to 6pm - Pager - [phone]  After 6pm call admission pager [phone]  [facility]  [phone]  CC: Primary care physician; Hannah Beat, MD

## 2018-03-16 ENCOUNTER — Encounter: Payer: Self-pay | Admitting: Radiology

## 2018-03-16 ENCOUNTER — Observation Stay: Payer: BLUE CROSS/BLUE SHIELD

## 2018-03-16 DIAGNOSIS — K831 Obstruction of bile duct: Secondary | ICD-10-CM | POA: Diagnosis not present

## 2018-03-16 DIAGNOSIS — R935 Abnormal findings on diagnostic imaging of other abdominal regions, including retroperitoneum: Secondary | ICD-10-CM | POA: Diagnosis not present

## 2018-03-16 DIAGNOSIS — K8020 Calculus of gallbladder without cholecystitis without obstruction: Secondary | ICD-10-CM | POA: Diagnosis not present

## 2018-03-16 DIAGNOSIS — R945 Abnormal results of liver function studies: Secondary | ICD-10-CM | POA: Diagnosis not present

## 2018-03-16 DIAGNOSIS — R748 Abnormal levels of other serum enzymes: Secondary | ICD-10-CM

## 2018-03-16 DIAGNOSIS — K8689 Other specified diseases of pancreas: Secondary | ICD-10-CM | POA: Diagnosis not present

## 2018-03-16 DIAGNOSIS — R1013 Epigastric pain: Secondary | ICD-10-CM | POA: Diagnosis not present

## 2018-03-16 DIAGNOSIS — F419 Anxiety disorder, unspecified: Secondary | ICD-10-CM | POA: Diagnosis not present

## 2018-03-16 DIAGNOSIS — R110 Nausea: Secondary | ICD-10-CM | POA: Diagnosis not present

## 2018-03-16 DIAGNOSIS — R109 Unspecified abdominal pain: Secondary | ICD-10-CM | POA: Diagnosis not present

## 2018-03-16 LAB — CBC
HCT: 44.4 % (ref 39.0–52.0)
Hemoglobin: 15.2 g/dL (ref 13.0–17.0)
MCH: 32.7 pg (ref 26.0–34.0)
MCHC: 34.2 g/dL (ref 30.0–36.0)
MCV: 95.5 fL (ref 80.0–100.0)
Platelets: 173 10*3/uL (ref 150–400)
RBC: 4.65 MIL/uL (ref 4.22–5.81)
RDW: 12.1 % (ref 11.5–15.5)
WBC: 7 10*3/uL (ref 4.0–10.5)
nRBC: 0 % (ref 0.0–0.2)

## 2018-03-16 LAB — HEPATIC FUNCTION PANEL
ALT: 345 U/L — ABNORMAL HIGH (ref 0–44)
AST: 290 U/L — ABNORMAL HIGH (ref 15–41)
Albumin: 3.7 g/dL (ref 3.5–5.0)
Alkaline Phosphatase: 139 U/L — ABNORMAL HIGH (ref 38–126)
Bilirubin, Direct: 3.9 mg/dL — ABNORMAL HIGH (ref 0.0–0.2)
Indirect Bilirubin: 2.3 mg/dL — ABNORMAL HIGH (ref 0.3–0.9)
TOTAL PROTEIN: 6.4 g/dL — AB (ref 6.5–8.1)
Total Bilirubin: 6.2 mg/dL — ABNORMAL HIGH (ref 0.3–1.2)

## 2018-03-16 LAB — BASIC METABOLIC PANEL
Anion gap: 6 (ref 5–15)
BUN: 10 mg/dL (ref 6–20)
CO2: 25 mmol/L (ref 22–32)
Calcium: 8.6 mg/dL — ABNORMAL LOW (ref 8.9–10.3)
Chloride: 105 mmol/L (ref 98–111)
Creatinine, Ser: 0.73 mg/dL (ref 0.61–1.24)
GFR calc Af Amer: >60 mL/min (ref >60)
GFR calc non Af Amer: >60 mL/min (ref >60)
Glucose, Bld: 108 mg/dL — ABNORMAL HIGH (ref 70–99)
Potassium: 3.8 mmol/L (ref 3.5–5.1)
Sodium: 136 mmol/L (ref 135–145)

## 2018-03-16 MED ORDER — OXYCODONE HCL 5 MG PO TABS
5.0000 mg | ORAL_TABLET | ORAL | Status: DC | PRN
  Administered 2018-03-16 – 2018-03-17 (×4): 5 mg via ORAL
  Filled 2018-03-16 (×4): qty 1

## 2018-03-16 MED ORDER — DEXTROSE-NACL 5-0.45 % IV SOLN
INTRAVENOUS | Status: DC
  Administered 2018-03-16 – 2018-03-17 (×2): via INTRAVENOUS

## 2018-03-16 MED ORDER — GADOBUTROL 1 MMOL/ML IV SOLN
7.5000 mL | Freq: Once | INTRAVENOUS | Status: AC | PRN
  Administered 2018-03-16: 7.5 mL via INTRAVENOUS

## 2018-03-16 MED ORDER — CITALOPRAM HYDROBROMIDE 20 MG PO TABS
20.0000 mg | ORAL_TABLET | Freq: Every day | ORAL | Status: DC
  Administered 2018-03-16 – 2018-03-17 (×3): 20 mg via ORAL
  Filled 2018-03-16 (×3): qty 1

## 2018-03-16 MED ORDER — TRAMADOL HCL 50 MG PO TABS
50.0000 mg | ORAL_TABLET | Freq: Four times a day (QID) | ORAL | Status: DC | PRN
  Administered 2018-03-17: 50 mg via ORAL
  Filled 2018-03-16: qty 1

## 2018-03-16 MED ORDER — TECHNETIUM TC 99M MEBROFENIN IV KIT
5.0000 | PACK | Freq: Once | INTRAVENOUS | Status: AC | PRN
  Administered 2018-03-16: 7.4 via INTRAVENOUS

## 2018-03-16 NOTE — Consult Note (Signed)
Midge Miniumarren Joh Rao, MD [facility]  [address] [place], [place] [address] Phone: [phone] Fax : [phone]  Consultation  Referring Provider:     Dr. Amado CoeGouru Primary Care Physician:  Hannah Beatopland, Spencer, MD Primary Gastroenterologist:       Gentry FitzUnassigned    Reason for Consultation:     Abdominal pain  Date of Admission:  03/15/2018 Date of Consultation:  03/16/2018         HPI:   David Sheppard is a 53 y.o. male was admitted with epigastric pain.  The patient reports that his pain has been more frequent usually at night.  He is not sure that food has anything to do with it.  The patient states that it got so bad that he had to come to the emergency room.  The patient had an upper endoscopy back in 2015 with an ulcer found.  At that time the patient was having bleeding from the ulcer.  There is no report of any previous colonoscopies.  He took some Pepto-Bismol and Mylanta but the pain did not go away.  He has been on [employer] Pepsioody powders.  There is no report of any worsening with greasy or fatty foods.  There is no report of any unexplained weight loss.  The patient was noted to have increased liver enzymes and was sent down for HIDA scan.  The patient also had a GI cocktail in the ER which caused him to have nausea and vomiting. The patient's total bilirubin on admission was 3.2 with a AST of 567 and ALT of 432.  The patient's AST came down to 290 today with the ALT being 345.  The patient's bilirubin did go up to 6.2. Patient's cardiac enzymes were negative for any sign of ischemia.  Past Medical History:  Diagnosis Date  . Allergy   . Generalized anxiety disorder 07/30/2011  . GERD (gastroesophageal reflux disease)   . Migraine   . Panic attacks 07/30/2011    Past Surgical History:  Procedure Laterality Date  . HERNIA REPAIR    . TONSILLECTOMY AND ADENOIDECTOMY      Prior to Admission medications   Medication Sig Start Date End Date Taking? Authorizing Provider  citalopram (CELEXA) 20  MG tablet TAKE 1 TABLET BY MOUTH EVERY DAY 09/26/17  Yes Copland, Karleen HampshireSpencer, MD  pantoprazole (PROTONIX) 40 MG tablet TAKE 1 TABLET BY MOUTH EVERY DAY 09/26/17  Yes Copland, Karleen HampshireSpencer, MD  SUMAtriptan (IMITREX) 100 MG tablet TAKE 1 TABLET BY MOUTH AS NEEDED FOR MIGRAINE. MAY REPEAT IN 2 HRS IF NEEDED. MAX 2TABS/DAY 08/16/17  Yes Copland, Karleen HampshireSpencer, MD    Family History  Problem Relation Age of Onset  . Hypertension Father   . Heart disease Paternal Grandfather   . Hyperlipidemia Paternal Grandfather   . Diabetes Paternal Grandfather   . Hyperlipidemia Paternal Grandmother   . Diabetes Paternal Grandmother      Social History   Tobacco Use  . Smoking status: Current Every Day Smoker    Packs/day: 0.50    Types: Cigarettes  . Smokeless tobacco: Former Engineer, waterUser  Substance Use Topics  . Alcohol use: Yes    Alcohol/week: 0.0 standard drinks    Comment: occ  . Drug use: No    Allergies as of 03/15/2018  . (No Known Allergies)    Review of Systems:    All systems reviewed and negative except where noted in HPI.   Physical Exam:  Vital signs in last 24 hours: Temp:  [97.9 F (  36.6 C)-99.1 F (37.3 C)] 97.9 F (36.6 C) (01/02 0444) Pulse Rate:  [80-97] 80 (01/02 0444) Resp:  [13-30] 16 (01/02 0444) BP: (95-123)/(66-81) 95/66 (01/02 0444) SpO2:  [93 %-97 %] 93 % (01/02 0444) Last BM Date: 03/15/18 General:   Pleasant, cooperative in NAD Head:  Normocephalic and atraumatic. Eyes:   No icterus.   Conjunctiva pink. PERRLA. Ears:  Normal auditory acuity. Neck:  Supple; no masses or thyroidomegaly Lungs: Respirations even and unlabored. Lungs clear to auscultation bilaterally.   No wheezes, crackles, or rhonchi.  Heart:  Regular rate and rhythm;  Without murmur, clicks, rubs or gallops Abdomen:  Soft, nondistended, nontender. Normal bowel sounds. No appreciable masses or hepatomegaly.  No rebound or guarding.  Rectal:  Not performed. Msk:  Symmetrical without gross deformities.      Extremities:  Without edema, cyanosis or clubbing. Neurologic:  Alert and oriented x3;  grossly normal neurologically. Skin:  Intact without significant lesions or rashes. Cervical Nodes:  No significant cervical adenopathy. Psych:  Alert and cooperative. Normal affect.  LAB RESULTS: Recent Labs    03/15/18 1044 03/16/18 0553  WBC 7.4 7.0  HGB 15.6 15.2  HCT 46.0 44.4  PLT 183 173   BMET Recent Labs    03/15/18 1044 03/16/18 0553  NA 139 136  K 3.9 3.8  CL 106 105  CO2 26 25  GLUCOSE 97 108*  BUN 13 10  CREATININE 0.89 0.73  CALCIUM 8.9 8.6*   LFT Recent Labs    03/16/18 0553  PROT 6.4*  ALBUMIN 3.7  AST 290*  ALT 345*  ALKPHOS 139*  BILITOT 6.2*  BILIDIR 3.9*  IBILI 2.3*   PT/INR No results for input(s): LABPROT, INR in the last 72 hours.  STUDIES: Dg Chest 2 View  Result Date: 03/15/2018 CLINICAL DATA:  53 year old male with history of central chest pain intermittently for the past 1-2 weeks. EXAM: CHEST - 2 VIEW COMPARISON:  None. FINDINGS: Lung volumes are normal. No consolidative airspace disease. No pleural effusions. No pneumothorax. No pulmonary nodule or mass noted. Pulmonary vasculature and the cardiomediastinal silhouette are within normal limits. IMPRESSION: No radiographic evidence of acute cardiopulmonary disease. Electronically Signed   By: Trudie Reed M.D.   On: 03/15/2018 11:28   Nm Hepatobiliary Liver Func  Result Date: 03/16/2018 CLINICAL DATA:  Nausea with pain for 1-2 weeks. EXAM: NUCLEAR MEDICINE HEPATOBILIARY IMAGING TECHNIQUE: Sequential images of the abdomen were obtained out to 60 minutes following intravenous administration of radiopharmaceutical. RADIOPHARMACEUTICALS:  7.4 mCi Tc-59m  Choletec IV COMPARISON:  None FINDINGS: There is prompt uptake of the radiopharmaceutical by the liver. In the first hour there is very mild radiotracer accumulation within the gallbladder. In the second hour radiotracer accumulation within the  gallbladder becomes more apparent. No biliary to bowel transit identified within the first or second hour. Delayed imaging was obtained at 2 hours and 30 minutes which shows radiotracer activity in the small bowel. Persistent intense radiotracer activity noted throughout the liver up to 2 hours and 60 minutes. IMPRESSION: 1. Patent cystic duct without evidence for acute cholecystitis. 2. Delayed and diminished biliary to bowel transit rays is noted. This raises suspicion of partial obstruction of the common bile duct. Alternatively, findings may be due to severe hepatocellular dysfunction. Electronically Signed   By: Signa Kell M.D.   On: 03/16/2018 14:40   Dg Abd 2 Views  Result Date: 03/15/2018 CLINICAL DATA:  Acute epigastric abdominal pain. EXAM: ABDOMEN - 2 VIEW COMPARISON:  None. FINDINGS: The bowel gas pattern is normal. There is no evidence of free air. No radio-opaque calculi or other significant radiographic abnormality is seen. IMPRESSION: No evidence of bowel obstruction or ileus. Electronically Signed   By: Lupita RaiderJames  Green Jr, M.D.   On: 03/15/2018 14:56      Impression / Plan:   Assessment: Active Problems:   Epigastric pain   David Sheppard is a 53 y.o. y/o male with noncardiac chest pain/epigastric pain.  The patient also reports the pain has been in his lower abdomen and in the right upper quadrant at times.  The patient has had abnormal liver enzymes with an increasing bilirubin and decreasing AST and ALT.  The patient had a HIDA scan today but the results are pending.  Plan:  The patient will await the HIDA scan results.  If the HIDA scan shows a prior with the gallbladder then surgery should be consulted.  If the patient's HIDA scan is negative then the patient will be set up for an EGD for tomorrow.  The patient states he is hungry and would like to be fed.  If the patient is set up for a upper endoscopy tomorrow then he could be put on a regular diet today after the HIDA  scan is back and negative.  The patient and his family have been explained the plan and agree with it.  Thank you for involving me in the care of this patient.      LOS: 0 days   Midge Miniumarren Jamaia Brum, MD  03/16/2018, 2:44 PM    Note: This dictation was prepared with Dragon dictation along with smaller phrase technology. Any transcriptional errors that result from this process are unintentional.

## 2018-03-16 NOTE — Progress Notes (Signed)
[facility] - [place] at [facility]   PATIENT NAME: David Sheppard    MR#:  [id number]  DATE OF BIRTH:  [birth year]  SUBJECTIVE:  CHIEF COMPLAINT: Patient's abdominal pain is at 4 out of 10 with the current pain medications.  Denies any chest pain but reporting epigastric and right-sided abdominal pain.  Admits to taking Goody powders.  Family at bedside.  REVIEW OF SYSTEMS:  CONSTITUTIONAL: No fever, fatigue or weakness.  EYES: No blurred or double vision.  EARS, NOSE, AND THROAT: No tinnitus or ear pain.  RESPIRATORY: No cough, shortness of breath, wheezing or hemoptysis.  CARDIOVASCULAR: No chest pain, orthopnea, edema.  GASTROINTESTINAL: No nausea, vomiting, diarrhea.  Reporting epigastric and right-sided abdominal pain.  GENITOURINARY: No dysuria, hematuria.  ENDOCRINE: No polyuria, nocturia,  HEMATOLOGY: No anemia, easy bruising or bleeding SKIN: No rash or lesion. MUSCULOSKELETAL: No joint pain or arthritis.   NEUROLOGIC: No tingling, numbness, weakness.  PSYCHIATRY: No anxiety or depression.   DRUG ALLERGIES:  No Known Allergies  VITALS:  Blood pressure 95/66, pulse 80, temperature 97.9 F (36.6 C), temperature source Oral, resp. rate 16, height 5\' 8"  (1.727 m), weight 81.6 kg, SpO2 93 %.  PHYSICAL EXAMINATION:  GENERAL:  53 y.o.-year-old patient lying in the bed with no acute distress.  EYES: Pupils equal, round, reactive to light and accommodation. No scleral icterus. Extraocular muscles intact.  HEENT: Head atraumatic, normocephalic. Oropharynx and nasopharynx clear.  NECK:  Supple, no jugular venous distention. No thyroid enlargement, no tenderness.  LUNGS: Normal breath sounds bilaterally, no wheezing, rales,rhonchi or crepitation. No use of accessory muscles of respiration.  CARDIOVASCULAR: S1, S2 normal. No murmurs, rubs, or gallops.  ABDOMEN: Soft, epigastric and right-sided tenderness is present nondistended. Bowel sounds present   EXTREMITIES: No pedal edema, cyanosis, or clubbing.  NEUROLOGIC: Awake, alert and oriented x3 sensation intact. Gait not checked.  PSYCHIATRIC: The patient is alert and oriented x 3.  SKIN: No obvious rash, lesion, or ulcer.    LABORATORY PANEL:   CBC Recent Labs  Lab 03/16/18 0553  WBC 7.0  HGB 15.2  HCT 44.4  PLT 173   ------------------------------------------------------------------------------------------------------------------  Chemistries  Recent Labs  Lab 03/16/18 0553  NA 136  K 3.8  CL 105  CO2 25  GLUCOSE 108*  BUN 10  CREATININE 0.73  CALCIUM 8.6*  AST 290*  ALT 345*  ALKPHOS 139*  BILITOT 6.2*   ------------------------------------------------------------------------------------------------------------------  Cardiac Enzymes Recent Labs  Lab 03/15/18 1829  TROPONINI <0.03   ------------------------------------------------------------------------------------------------------------------  RADIOLOGY:  Dg Chest 2 View  Result Date: 03/15/2018 CLINICAL DATA:  53 year old male with history of central chest pain intermittently for the past 1-2 weeks. EXAM: CHEST - 2 VIEW COMPARISON:  None. FINDINGS: Lung volumes are normal. No consolidative airspace disease. No pleural effusions. No pneumothorax. No pulmonary nodule or mass noted. Pulmonary vasculature and the cardiomediastinal silhouette are within normal limits. IMPRESSION: No radiographic evidence of acute cardiopulmonary disease. Electronically Signed   By: Trudie Reedaniel  Entrikin M.D.   On: 03/15/2018 11:28   Nm Hepatobiliary Liver Func  Result Date: 03/16/2018 CLINICAL DATA:  Nausea with pain for 1-2 weeks. EXAM: NUCLEAR MEDICINE HEPATOBILIARY IMAGING TECHNIQUE: Sequential images of the abdomen were obtained out to 60 minutes following intravenous administration of radiopharmaceutical. RADIOPHARMACEUTICALS:  7.4 mCi Tc-3146m  Choletec IV COMPARISON:  None FINDINGS: There is prompt uptake of the  radiopharmaceutical by the liver. In the first hour there is very mild radiotracer accumulation within the gallbladder. In the  second hour radiotracer accumulation within the gallbladder becomes more apparent. No biliary to bowel transit identified within the first or second hour. Delayed imaging was obtained at 2 hours and 30 minutes which shows radiotracer activity in the small bowel. Persistent intense radiotracer activity noted throughout the liver up to 2 hours and 60 minutes. IMPRESSION: 1. Patent cystic duct without evidence for acute cholecystitis. 2. Delayed and diminished biliary to bowel transit rays is noted. This raises suspicion of partial obstruction of the common bile duct. Alternatively, findings may be due to severe hepatocellular dysfunction. Electronically Signed   By: Signa Kell M.D.   On: 03/16/2018 14:40   Dg Abd 2 Views  Result Date: 03/15/2018 CLINICAL DATA:  Acute epigastric abdominal pain. EXAM: ABDOMEN - 2 VIEW COMPARISON:  None. FINDINGS: The bowel gas pattern is normal. There is no evidence of free air. No radio-opaque calculi or other significant radiographic abnormality is seen. IMPRESSION: No evidence of bowel obstruction or ileus. Electronically Signed   By: Lupita Raider, M.D.   On: 03/15/2018 14:56    EKG:   Orders placed or performed during the hospital encounter of 03/15/18  . EKG 12-Lead  . EKG 12-Lead  . ED EKG within 10 minutes  . ED EKG within 10 minutes    ASSESSMENT AND PLAN:    1.  Acute right upper quadrant and Epigastric abdominal pain. N.p.o. HIDA scan is abnormal with concern for partial obstruction of CBD.  Patent cystic duct MRCP ordered, patient might need ERCP in a.m. if MRCP is abnormal GI is following appreciate the recommendations   2.  History of peptic ulcer disease with bleeding ulcers per previous EGD Patient was instructed not to take Goody powders PPI Avoid NSAIDs GI is following  3.  History of migraine headaches  -takes Imitrex  # .  Anxiety on Celexa  #Tobacco abuse disorder counseled patient to quit smoking for 5 minutes.  He verbalized understanding of the plan.  Nicotine patch ordered   All the records are reviewed and case discussed with Care Management/Social Workerr. Management plans discussed with the patient, family and they are in agreement.  CODE STATUS: FC   TOTAL TIME TAKING CARE OF THIS PATIENT: 39 minutes.   POSSIBLE D/C IN 1-2  DAYS, DEPENDING ON CLINICAL CONDITION.  Note: This dictation was prepared with Dragon dictation along with smaller phrase technology. Any transcriptional errors that result from this process are unintentional.   Ramonita Lab M.D on 03/16/2018 at 2:59 PM  Between 7am to 6pm - Pager - [phone] After 6pm go to [email or web address] - password [email or web address] [facility]  [place] [facility]  Office  [phone]  CC: Primary care physician; Hannah Beat, MD

## 2018-03-17 ENCOUNTER — Observation Stay: Payer: BLUE CROSS/BLUE SHIELD | Admitting: Anesthesiology

## 2018-03-17 ENCOUNTER — Other Ambulatory Visit: Payer: Self-pay | Admitting: Family Medicine

## 2018-03-17 ENCOUNTER — Encounter: Payer: Self-pay | Admitting: *Deleted

## 2018-03-17 ENCOUNTER — Encounter
Admission: EM | Disposition: A | Discharge status: Home / Self Care | Payer: Self-pay | Source: Home / Self Care | Attending: Internal Medicine

## 2018-03-17 DIAGNOSIS — K851 Biliary acute pancreatitis without necrosis or infection: Secondary | ICD-10-CM | POA: Diagnosis not present

## 2018-03-17 DIAGNOSIS — K8080 Other cholelithiasis without obstruction: Secondary | ICD-10-CM | POA: Diagnosis not present

## 2018-03-17 DIAGNOSIS — R1013 Epigastric pain: Secondary | ICD-10-CM | POA: Diagnosis not present

## 2018-03-17 DIAGNOSIS — R748 Abnormal levels of other serum enzymes: Secondary | ICD-10-CM | POA: Diagnosis not present

## 2018-03-17 HISTORY — PX: CHOLECYSTECTOMY: SHX55

## 2018-03-17 LAB — COMPREHENSIVE METABOLIC PANEL WITH GFR
ALT: 255 U/L — ABNORMAL HIGH (ref 0–44)
AST: 107 U/L — ABNORMAL HIGH (ref 15–41)
Albumin: 4 g/dL (ref 3.5–5.0)
Alkaline Phosphatase: 183 U/L — ABNORMAL HIGH (ref 38–126)
Anion gap: 7 (ref 5–15)
BUN: 9 mg/dL (ref 6–20)
CO2: 25 mmol/L (ref 22–32)
Calcium: 8.6 mg/dL — ABNORMAL LOW (ref 8.9–10.3)
Chloride: 104 mmol/L (ref 98–111)
Creatinine, Ser: 0.71 mg/dL (ref 0.61–1.24)
GFR calc Af Amer: >60 mL/min
GFR calc non Af Amer: >60 mL/min
Glucose, Bld: 109 mg/dL — ABNORMAL HIGH (ref 70–99)
Potassium: 4.3 mmol/L (ref 3.5–5.1)
Sodium: 136 mmol/L (ref 135–145)
Total Bilirubin: 2.3 mg/dL — ABNORMAL HIGH (ref 0.3–1.2)
Total Protein: 7.2 g/dL (ref 6.5–8.1)

## 2018-03-17 LAB — CBC
HEMATOCRIT: 43.9 % (ref 39.0–52.0)
Hemoglobin: 14.9 g/dL (ref 13.0–17.0)
MCH: 32.5 pg (ref 26.0–34.0)
MCHC: 33.9 g/dL (ref 30.0–36.0)
MCV: 95.6 fL (ref 80.0–100.0)
Platelets: 176 10*3/uL (ref 150–400)
RBC: 4.59 MIL/uL (ref 4.22–5.81)
RDW: 12.1 % (ref 11.5–15.5)
WBC: 11.1 10*3/uL — ABNORMAL HIGH (ref 4.0–10.5)
nRBC: 0 % (ref 0.0–0.2)

## 2018-03-17 LAB — LIPASE, BLOOD: Lipase: 98 U/L — ABNORMAL HIGH (ref 11–51)

## 2018-03-17 LAB — HIV ANTIBODY (ROUTINE TESTING W REFLEX): HIV Screen 4th Generation wRfx: NONREACTIVE

## 2018-03-17 SURGERY — LAPAROSCOPIC CHOLECYSTECTOMY
Anesthesia: General

## 2018-03-17 SURGERY — ESOPHAGOGASTRODUODENOSCOPY (EGD) WITH PROPOFOL
Anesthesia: General

## 2018-03-17 MED ORDER — LACTATED RINGERS IV SOLN
INTRAVENOUS | Status: DC | PRN
  Administered 2018-03-17: 13:00:00 via INTRAVENOUS

## 2018-03-17 MED ORDER — LABETALOL HCL 5 MG/ML IV SOLN
INTRAVENOUS | Status: DC | PRN
  Administered 2018-03-17: 7.5 mg via INTRAVENOUS

## 2018-03-17 MED ORDER — ONDANSETRON HCL 4 MG/2ML IJ SOLN
INTRAMUSCULAR | Status: AC
  Filled 2018-03-17: qty 2

## 2018-03-17 MED ORDER — PROPOFOL 10 MG/ML IV BOLUS
INTRAVENOUS | Status: AC
  Filled 2018-03-17: qty 40

## 2018-03-17 MED ORDER — EPHEDRINE SULFATE 50 MG/ML IJ SOLN
INTRAMUSCULAR | Status: DC | PRN
  Administered 2018-03-17: 7.5 mg via INTRAVENOUS

## 2018-03-17 MED ORDER — ROCURONIUM BROMIDE 50 MG/5ML IV SOLN
INTRAVENOUS | Status: AC
  Filled 2018-03-17: qty 1

## 2018-03-17 MED ORDER — SUGAMMADEX SODIUM 200 MG/2ML IV SOLN
INTRAVENOUS | Status: DC | PRN
  Administered 2018-03-17: 175 mg via INTRAVENOUS

## 2018-03-17 MED ORDER — OXYCODONE HCL 5 MG PO TABS
5.0000 mg | ORAL_TABLET | ORAL | Status: DC | PRN
  Administered 2018-03-17 – 2018-03-18 (×5): 10 mg via ORAL
  Filled 2018-03-17 (×5): qty 2

## 2018-03-17 MED ORDER — FENTANYL CITRATE (PF) 100 MCG/2ML IJ SOLN
INTRAMUSCULAR | Status: DC | PRN
  Administered 2018-03-17: 50 ug via INTRAVENOUS
  Administered 2018-03-17: 100 ug via INTRAVENOUS
  Administered 2018-03-17: 50 ug via INTRAVENOUS

## 2018-03-17 MED ORDER — CEFAZOLIN SODIUM-DEXTROSE 1-4 GM/50ML-% IV SOLN
INTRAVENOUS | Status: DC | PRN
  Administered 2018-03-17: 2 g via INTRAVENOUS

## 2018-03-17 MED ORDER — LABETALOL HCL 5 MG/ML IV SOLN
INTRAVENOUS | Status: AC
  Filled 2018-03-17: qty 4

## 2018-03-17 MED ORDER — PROPOFOL 10 MG/ML IV BOLUS
INTRAVENOUS | Status: DC | PRN
  Administered 2018-03-17: 150 mg via INTRAVENOUS

## 2018-03-17 MED ORDER — ROCURONIUM BROMIDE 100 MG/10ML IV SOLN
INTRAVENOUS | Status: DC | PRN
  Administered 2018-03-17: 50 mg via INTRAVENOUS

## 2018-03-17 MED ORDER — LIDOCAINE HCL (CARDIAC) PF 100 MG/5ML IV SOSY
PREFILLED_SYRINGE | INTRAVENOUS | Status: DC | PRN
  Administered 2018-03-17: 80 mg via INTRAVENOUS

## 2018-03-17 MED ORDER — MIDAZOLAM HCL 2 MG/2ML IJ SOLN
INTRAMUSCULAR | Status: DC | PRN
  Administered 2018-03-17: 2 mg via INTRAVENOUS

## 2018-03-17 MED ORDER — BUPIVACAINE HCL (PF) 0.25 % IJ SOLN
INTRAMUSCULAR | Status: AC
  Filled 2018-03-17: qty 30

## 2018-03-17 MED ORDER — ACETAMINOPHEN 325 MG PO TABS: 650.0000 mg | ORAL_TABLET | Freq: Four times a day (QID) | ORAL | Status: DC | PRN

## 2018-03-17 MED ORDER — SUGAMMADEX SODIUM 200 MG/2ML IV SOLN
INTRAVENOUS | Status: AC
  Filled 2018-03-17: qty 2

## 2018-03-17 MED ORDER — FENTANYL CITRATE (PF) 100 MCG/2ML IJ SOLN
INTRAMUSCULAR | Status: AC
  Filled 2018-03-17: qty 2

## 2018-03-17 MED ORDER — OXYCODONE HCL 5 MG/5ML PO SOLN: 5.0000 mg | Freq: Once | ORAL | Status: DC | PRN

## 2018-03-17 MED ORDER — PHENYLEPHRINE HCL 10 MG/ML IJ SOLN
INTRAMUSCULAR | Status: AC
  Filled 2018-03-17: qty 1

## 2018-03-17 MED ORDER — FENTANYL CITRATE (PF) 100 MCG/2ML IJ SOLN
25.0000 ug | INTRAMUSCULAR | Status: DC | PRN
  Administered 2018-03-17 (×3): 25 ug via INTRAVENOUS

## 2018-03-17 MED ORDER — EVICEL 2 ML EX KIT
PACK | CUTANEOUS | Status: DC | PRN
  Administered 2018-03-17: 1

## 2018-03-17 MED ORDER — DEXAMETHASONE SODIUM PHOSPHATE 10 MG/ML IJ SOLN
INTRAMUSCULAR | Status: DC | PRN
  Administered 2018-03-17: 5 mg via INTRAVENOUS

## 2018-03-17 MED ORDER — LIDOCAINE-EPINEPHRINE 1 %-1:100000 IJ SOLN
INTRAMUSCULAR | Status: DC | PRN
  Administered 2018-03-17: 7 mL

## 2018-03-17 MED ORDER — MEPERIDINE HCL 50 MG/ML IJ SOLN: 6.2500 mg | INTRAMUSCULAR | Status: DC | PRN

## 2018-03-17 MED ORDER — FENTANYL CITRATE (PF) 100 MCG/2ML IJ SOLN
INTRAMUSCULAR | Status: AC
  Administered 2018-03-17: 25 ug via INTRAVENOUS
  Filled 2018-03-17: qty 2

## 2018-03-17 MED ORDER — LIDOCAINE HCL (PF) 2 % IJ SOLN
INTRAMUSCULAR | Status: AC
  Filled 2018-03-17: qty 10

## 2018-03-17 MED ORDER — KETOROLAC TROMETHAMINE 30 MG/ML IJ SOLN
INTRAMUSCULAR | Status: AC
  Filled 2018-03-17: qty 1

## 2018-03-17 MED ORDER — MIDAZOLAM HCL 2 MG/2ML IJ SOLN
INTRAMUSCULAR | Status: AC
  Filled 2018-03-17: qty 2

## 2018-03-17 MED ORDER — SUMATRIPTAN SUCCINATE 50 MG PO TABS
25.0000 mg | ORAL_TABLET | ORAL | Status: DC | PRN
  Administered 2018-03-17: 25 mg via ORAL
  Filled 2018-03-17 (×2): qty 1

## 2018-03-17 MED ORDER — PROMETHAZINE HCL 25 MG/ML IJ SOLN: 6.2500 mg | INTRAMUSCULAR | Status: DC | PRN

## 2018-03-17 MED ORDER — EVICEL 2 ML EX KIT
PACK | CUTANEOUS | Status: AC
  Filled 2018-03-17: qty 1

## 2018-03-17 MED ORDER — LIDOCAINE-EPINEPHRINE 1 %-1:100000 IJ SOLN
INTRAMUSCULAR | Status: AC
  Filled 2018-03-17: qty 1

## 2018-03-17 MED ORDER — OXYCODONE HCL 5 MG PO TABS: 5.0000 mg | ORAL_TABLET | Freq: Once | ORAL | Status: DC | PRN

## 2018-03-17 SURGICAL SUPPLY — 51 items
ADH SKN CLS APL DERMABOND .7 (GAUZE/BANDAGES/DRESSINGS) ×1
APPLIER CLIP 5 13 M/L LIGAMAX5 (MISCELLANEOUS) ×2
APR CLP MED LRG 5 ANG JAW (MISCELLANEOUS) ×1
BAG SPEC RTRVL LRG 6X4 10 (ENDOMECHANICALS) ×1
BLADE SURG SZ11 CARB STEEL (BLADE) ×2 IMPLANT
CANISTER SUCT 1200ML W/VALVE (MISCELLANEOUS) ×2 IMPLANT
CHLORAPREP W/TINT 26ML (MISCELLANEOUS) ×2 IMPLANT
CLIP APPLIE 5 13 M/L LIGAMAX5 (MISCELLANEOUS) ×1 IMPLANT
COVER WAND RF STERILE (DRAPES) ×2 IMPLANT
DECANTER SPIKE VIAL GLASS SM (MISCELLANEOUS) ×4 IMPLANT
DEFOGGER SCOPE WARMER CLEARIFY (MISCELLANEOUS) ×2 IMPLANT
DERMABOND ADVANCED (GAUZE/BANDAGES/DRESSINGS) ×1
DERMABOND ADVANCED .7 DNX12 (GAUZE/BANDAGES/DRESSINGS) ×1 IMPLANT
DRAPE SHEET LG 3/4 BI-LAMINATE (DRAPES) ×2 IMPLANT
ELECT CAUTERY BLADE TIP 2.5 (TIP) ×2
ELECT REM PT RETURN 9FT ADLT (ELECTROSURGICAL) ×2
ELECTRODE CAUTERY BLDE TIP 2.5 (TIP) ×1 IMPLANT
ELECTRODE REM PT RTRN 9FT ADLT (ELECTROSURGICAL) ×1 IMPLANT
FILTER LAP SMOKE EVAC STRL (MISCELLANEOUS) ×2 IMPLANT
GLOVE BIO SURGEON STRL SZ 6.5 (GLOVE) ×2 IMPLANT
GLOVE INDICATOR 7.0 STRL GRN (GLOVE) ×2 IMPLANT
GOWN STRL REUS W/ TWL LRG LVL3 (GOWN DISPOSABLE) ×3 IMPLANT
GOWN STRL REUS W/TWL LRG LVL3 (GOWN DISPOSABLE) ×6
GRASPER SUT TROCAR 14GX15 (MISCELLANEOUS) IMPLANT
HEMOSTAT SURGICEL 2X3 (HEMOSTASIS) ×1 IMPLANT
IRRIGATION STRYKERFLOW (MISCELLANEOUS) ×1 IMPLANT
IRRIGATOR STRYKERFLOW (MISCELLANEOUS) ×2
IV NS 1000ML (IV SOLUTION) ×2
IV NS 1000ML BAXH (IV SOLUTION) ×1 IMPLANT
KIT TURNOVER KIT A (KITS) ×2 IMPLANT
L-HOOK LAP DISP 36CM (ELECTROSURGICAL)
LABEL OR SOLS (LABEL) ×2 IMPLANT
LHOOK LAP DISP 36CM (ELECTROSURGICAL) IMPLANT
NEEDLE HYPO 22GX1.5 SAFETY (NEEDLE) ×2 IMPLANT
NS IRRIG 500ML POUR BTL (IV SOLUTION) ×2 IMPLANT
PACK LAP CHOLECYSTECTOMY (MISCELLANEOUS) ×2 IMPLANT
PENCIL ELECTRO HAND CTR (MISCELLANEOUS) ×2 IMPLANT
POUCH SPECIMEN RETRIEVAL 10MM (ENDOMECHANICALS) ×2 IMPLANT
SCISSORS METZENBAUM CVD 33 (INSTRUMENTS) ×2 IMPLANT
SLEEVE ADV FIXATION 5X100MM (TROCAR) ×2 IMPLANT
STRIP CLOSURE SKIN 1/2X4 (GAUZE/BANDAGES/DRESSINGS) ×2 IMPLANT
SUT MNCRL 4-0 (SUTURE) ×2
SUT MNCRL 4-0 27XMFL (SUTURE) ×1
SUT VICRYL 0 AB UR-6 (SUTURE) ×4 IMPLANT
SUT VICRYL+ 3-0 27IN RB-1 (SUTURE) ×2 IMPLANT
SUTURE MNCRL 4-0 27XMF (SUTURE) ×1 IMPLANT
TIP RIGID 35CM EVICEL (HEMOSTASIS) ×1 IMPLANT
TROCAR BALLN GELPORT 12X130M (ENDOMECHANICALS) ×2 IMPLANT
TROCAR Z-THREAD OPTICAL 5X100M (TROCAR) ×2 IMPLANT
TUBING INSUFFLATION (TUBING) ×2 IMPLANT
WATER STERILE IRR 1000ML POUR (IV SOLUTION) ×2 IMPLANT

## 2018-03-17 NOTE — Progress Notes (Signed)
Midge Minium, MD [facility]   [address] [place], [place] [address] Phone: [phone] Fax : [phone]   Subjective: This patient had MRCP last night without any stone seen in the common bile duct.  The patient did have signs on the MRI consistent with pancreatitis.  It was recommended the patient have a lipase checked.  He reports that his abdominal pain is still present but not as bad as it was before.  He has had no nausea or vomiting.  His main complaint today is a migraine headache.   Objective: Vital signs in last 24 hours: Vitals:   03/16/18 0444 03/16/18 1537 03/16/18 2130 03/17/18 0256  BP: 95/66 115/75 113/79 116/80  Pulse: 80 81 91 96  Resp: 16 17 16 16   Temp: 97.9 F (36.6 C) 98.7 F (37.1 C) 98.5 F (36.9 C) 98.8 F (37.1 C)  TempSrc: Oral Oral Oral Oral  SpO2: 93% 97% 96% 95%  Weight:      Height:       Weight change:   Intake/Output Summary (Last 24 hours) at 03/17/2018 0951 Last data filed at 03/17/2018 0747 Gross per 24 hour  Intake 1510.99 ml  Output 200 ml  Net 1310.99 ml     Exam: Heart:: Regular rate and rhythm, S1S2 present or without murmur or extra heart sounds Lungs: normal and clear to auscultation and percussion Abdomen: soft, nontender, normal bowel sounds   Lab Results: @LABTEST2 @ Micro Results: No results found for this or any previous visit (from the past 240 hour(s)). Studies/Results: Dg Chest 2 View  Result Date: 03/15/2018 CLINICAL DATA:  53 year old male with history of central chest pain intermittently for the past 1-2 weeks. EXAM: CHEST - 2 VIEW COMPARISON:  None. FINDINGS: Lung volumes are normal. No consolidative airspace disease. No pleural effusions. No pneumothorax. No pulmonary nodule or mass noted. Pulmonary vasculature and the cardiomediastinal silhouette are within normal limits. IMPRESSION: No radiographic evidence of acute cardiopulmonary disease. Electronically Signed   By: Trudie Reed M.D.   On: 03/15/2018  11:28   Nm Hepatobiliary Liver Func  Result Date: 03/16/2018 CLINICAL DATA:  Nausea with pain for 1-2 weeks. EXAM: NUCLEAR MEDICINE HEPATOBILIARY IMAGING TECHNIQUE: Sequential images of the abdomen were obtained out to 60 minutes following intravenous administration of radiopharmaceutical. RADIOPHARMACEUTICALS:  7.4 mCi Tc-28m  Choletec IV COMPARISON:  None FINDINGS: There is prompt uptake of the radiopharmaceutical by the liver. In the first hour there is very mild radiotracer accumulation within the gallbladder. In the second hour radiotracer accumulation within the gallbladder becomes more apparent. No biliary to bowel transit identified within the first or second hour. Delayed imaging was obtained at 2 hours and 30 minutes which shows radiotracer activity in the small bowel. Persistent intense radiotracer activity noted throughout the liver up to 2 hours and 60 minutes. IMPRESSION: 1. Patent cystic duct without evidence for acute cholecystitis. 2. Delayed and diminished biliary to bowel transit rays is noted. This raises suspicion of partial obstruction of the common bile duct. Alternatively, findings may be due to severe hepatocellular dysfunction. Electronically Signed   By: Signa Kell M.D.   On: 03/16/2018 14:40   Mr 3d Recon At Scanner  Result Date: 03/16/2018 CLINICAL DATA:  Epigastric abdominal pain for 1.5 weeks. Abnormal liver function tests with rising elevated total bilirubin. EXAM: MRI ABDOMEN WITHOUT AND WITH CONTRAST (INCLUDING MRCP) TECHNIQUE: Multiplanar multisequence MR imaging of the abdomen was performed both before and after the administration of intravenous contrast. Heavily T2-weighted images of  the biliary and pancreatic ducts were obtained, and three-dimensional MRCP images were rendered by post processing. CONTRAST:  7.5 cc Gadavist IV. COMPARISON:  03/16/2018 hepatobiliary scintigraphy study. FINDINGS: Lower chest: Mild hypoventilatory changes at the dependent lung bases.  Hepatobiliary: Normal liver size and configuration. No hepatic steatosis. No liver mass. Subcentimeter layering gallstone in the nondistended gallbladder. Mild diffuse gallbladder wall thickening. No pericholecystic fluid. No biliary ductal dilatation. Common bile duct diameter 4 mm. No filling defects within the biliary tree. No biliary strictures or masses. Pancreas: There is mild pancreatic and peripancreatic edema within the pancreatic neck and proximal body, suggesting mild acute pancreatitis. No peripancreatic fluid collections. Preserved pancreatic parenchymal enhancement. No pancreatic mass or duct dilation. No pancreas divisum. Spleen: Normal size. No mass. Adrenals/Urinary Tract: Normal adrenals. No hydronephrosis. Normal kidneys with no renal mass. Stomach/Bowel: Normal non-distended stomach. Visualized small and large bowel is normal caliber, with no bowel wall thickening. Vascular/Lymphatic: Normal caliber abdominal aorta. Patent portal, splenic, hepatic and renal veins. No pathologically enlarged lymph nodes in the abdomen. Other: No abdominal ascites or focal fluid collection. Musculoskeletal: No aggressive appearing focal osseous lesions. IMPRESSION: 1. Mild pancreatic and peripancreatic edema within the pancreatic neck and proximal body, suggesting mild acute uncomplicated pancreatitis. Suggest correlation with serum lipase. 2. Cholelithiasis. Mild diffuse gallbladder wall thickening. No gallbladder distention or pericholecystic fluid. 3. No biliary ductal dilatation. CBD diameter 4 mm. No evidence of choledocholithiasis. Electronically Signed   By: Delbert PhenixJason A Poff M.D.   On: 03/16/2018 21:31   Dg Abd 2 Views  Result Date: 03/15/2018 CLINICAL DATA:  Acute epigastric abdominal pain. EXAM: ABDOMEN - 2 VIEW COMPARISON:  None. FINDINGS: The bowel gas pattern is normal. There is no evidence of free air. No radio-opaque calculi or other significant radiographic abnormality is seen. IMPRESSION: No evidence  of bowel obstruction or ileus. Electronically Signed   By: Lupita RaiderJames  Green Jr, M.D.   On: 03/15/2018 14:56   Mr Abdomen Mrcp Vivien RossettiW Wo Contast  Result Date: 03/16/2018 CLINICAL DATA:  Epigastric abdominal pain for 1.5 weeks. Abnormal liver function tests with rising elevated total bilirubin. EXAM: MRI ABDOMEN WITHOUT AND WITH CONTRAST (INCLUDING MRCP) TECHNIQUE: Multiplanar multisequence MR imaging of the abdomen was performed both before and after the administration of intravenous contrast. Heavily T2-weighted images of the biliary and pancreatic ducts were obtained, and three-dimensional MRCP images were rendered by post processing. CONTRAST:  7.5 cc Gadavist IV. COMPARISON:  03/16/2018 hepatobiliary scintigraphy study. FINDINGS: Lower chest: Mild hypoventilatory changes at the dependent lung bases. Hepatobiliary: Normal liver size and configuration. No hepatic steatosis. No liver mass. Subcentimeter layering gallstone in the nondistended gallbladder. Mild diffuse gallbladder wall thickening. No pericholecystic fluid. No biliary ductal dilatation. Common bile duct diameter 4 mm. No filling defects within the biliary tree. No biliary strictures or masses. Pancreas: There is mild pancreatic and peripancreatic edema within the pancreatic neck and proximal body, suggesting mild acute pancreatitis. No peripancreatic fluid collections. Preserved pancreatic parenchymal enhancement. No pancreatic mass or duct dilation. No pancreas divisum. Spleen: Normal size. No mass. Adrenals/Urinary Tract: Normal adrenals. No hydronephrosis. Normal kidneys with no renal mass. Stomach/Bowel: Normal non-distended stomach. Visualized small and large bowel is normal caliber, with no bowel wall thickening. Vascular/Lymphatic: Normal caliber abdominal aorta. Patent portal, splenic, hepatic and renal veins. No pathologically enlarged lymph nodes in the abdomen. Other: No abdominal ascites or focal fluid collection. Musculoskeletal: No aggressive  appearing focal osseous lesions. IMPRESSION: 1. Mild pancreatic and peripancreatic edema within the pancreatic neck and proximal  body, suggesting mild acute uncomplicated pancreatitis. Suggest correlation with serum lipase. 2. Cholelithiasis. Mild diffuse gallbladder wall thickening. No gallbladder distention or pericholecystic fluid. 3. No biliary ductal dilatation. CBD diameter 4 mm. No evidence of choledocholithiasis. Electronically Signed   By: Delbert Phenix M.D.   On: 03/16/2018 21:31   Medications: I have reviewed the patient's current medications. Scheduled Meds: . citalopram  20 mg Oral QHS  . pantoprazole (PROTONIX) IV  40 mg Intravenous Q12H   Continuous Infusions: . dextrose 5 % and 0.45% NaCl 125 mL/hr at 03/17/18 0747   PRN Meds:.alum & mag hydroxide-simeth, nicotine, ondansetron **OR** ondansetron (ZOFRAN) IV, oxyCODONE, SUMAtriptan, traMADol   Assessment: Active Problems:   Epigastric pain   Increased liver enzymes    Plan: The patient had elevated liver enzymes and a MRI showing stones in the gallbladder.  The patient also was seen to have mild pancreatitis.  The patient likely had passed a stone which caused him to have pancreatitis and pain.  I have spoken to surgery who is going to see the patient for possible cholecystectomy.  The patient will have an EGD today to rule out any peptic ulcer disease as a cause of his epigastric/noncardiac chest pain.  I am also waiting for his CBC CMP and lipase to come back from his morning blood draws.   LOS: 0 days   Midge Minium 03/17/2018, 9:51 AM

## 2018-03-17 NOTE — Op Note (Signed)
Laparoscopic Cholecystectomy  Pre-operative Diagnosis: gallstone pancreatitis  Post-operative Diagnosis: same  Procedure: laparoscopic cholecystectomy  Surgeon: Raysha Tilmon, MD  AneDuanne Guesssthesia: GETA  Assistant:non   Findings: Slightly distended, somewhat friable gallbladder, mild inflammation and cholecystitis   Estimated Blood Loss: <10 cc         Drains: none         Specimens: Gallbladder           Complications: none   Procedure Details  The patient was seen again in the preoperative holding area. The benefits, complications, treatment options, and expected outcomes were discussed with the patient. The risks of bleeding, infection, recurrence of symptoms, failure to resolve symptoms, bile duct damage, bile duct leak, retained common bile duct stone, bowel injury, any of which could require further surgery and/or ERCP, stent, or papillotomy were reviewed with the patient. The likelihood of improving the patient's symptoms with return to their baseline status is good.  The patient and/or family concurred with the proposed plan, giving informed consent.  The patient was taken to operating room, identified as David Sheppard and the procedure verified as Laparoscopic Cholecystectomy. A time out was performed and the above information confirmed.  Prior to the induction of general anesthesia, antibiotic prophylaxis was administered. VTE prophylaxis was in place. General endotracheal anesthesia was then administered and tolerated well. After the induction, the abdomen was prepped with Chloraprep and draped in the sterile fashion. The patient was positioned in the supine position.  Cut down technique was used to enter the abdominal cavity and a Hasson trochar was placed after two vicryl stitches were anchored to the fascia. Pneumoperitoneum was then created with CO2 and tolerated well without any adverse changes in the patient's vital signs.  Three 5-mm ports were placed in the right  upper quadrant all under direct vision. All skin incisions  were infiltrated with a local anesthetic agent before making the incision and placing the trocars.   The patient was positioned  in reverse Trendelenburg, tilted slightly to the patient's left.  The gallbladder was identified, the fundus grasped and retracted cephalad. Adhesions were lysed bluntly. The infundibulum was grasped and retracted laterally, exposing the peritoneum overlying the triangle of Calot. This was then divided and exposed in a blunt fashion. An extended critical view of the cystic duct and cystic artery was obtained.  The cystic duct was clearly identified and bluntly dissected free. Both the cystic artery and duct were double clipped and divided.  The gallbladder was taken from the gallbladder fossa in a retrograde fashion with the electrocautery. The gallbladder was removed and placed in an Endocatch bag. The liver bed was irrigated and inspected. Hemostasis was achieved with the electrocautery, Surgicel, and Evicel. Copious saline irrigation was utilized and was repeatedly aspirated until clear.  The gallbladder and Endocatch sac were then removed through a port site.    Inspection of the right upper quadrant was performed. No bleeding, bile duct injury or leak, or bowel injury was noted. Pneumoperitoneum was released.  The periumbilical port site was closed with interrumpted 0 Vicryl sutures. 4-0 subcuticular Monocryl was used to close the skin. Dermabond was applied.  The patient was then extubated and brought to the recovery room in stable condition. Sponge, lap, and needle counts were correct at closure and at the conclusion of the case.               David GuessJennifer Mellanie Bejarano, MD, FACS

## 2018-03-17 NOTE — Anesthesia Procedure Notes (Signed)
Procedure Name: Intubation Date/Time: 03/17/2018 1:33 PM Performed by: Chanetta Marshall, CRNA Pre-anesthesia Checklist: Patient identified, Emergency Drugs available, Suction available and Patient being monitored Patient Re-evaluated:Patient Re-evaluated prior to induction Oxygen Delivery Method: Circle system utilized Preoxygenation: Pre-oxygenation with 100% oxygen Induction Type: IV induction Ventilation: Mask ventilation without difficulty Laryngoscope Size: Mac and 3 Grade View: Grade II Tube type: Oral Tube size: 7.5 mm Number of attempts: 1 Airway Equipment and Method: Stylet Placement Confirmation: positive ETCO2,  ETT inserted through vocal cords under direct vision,  breath sounds checked- equal and bilateral and CO2 detector Secured at: 21 cm Tube secured with: Tape Dental Injury: Teeth and Oropharynx as per pre-operative assessment

## 2018-03-17 NOTE — Anesthesia Preprocedure Evaluation (Addendum)
Anesthesia Evaluation  Patient identified by MRN, date of birth, ID band Patient awake    Reviewed: Allergy & Precautions, NPO status , Patient's Chart, lab work & pertinent test results  History of Anesthesia Complications Negative for: history of anesthetic complications  Airway Mallampati: II  TM Distance: >3 FB Neck ROM: Full    Dental no notable dental hx.    Pulmonary neg sleep apnea, neg COPD, Current Smoker,    breath sounds clear to auscultation- rhonchi (-) wheezing      Cardiovascular Exercise Tolerance: Good (-) hypertension(-) CAD, (-) Past MI, (-) Cardiac Stents and (-) CABG  Rhythm:Regular Rate:Normal - Systolic murmurs and - Diastolic murmurs    Neuro/Psych  Headaches, PSYCHIATRIC DISORDERS Anxiety    GI/Hepatic Neg liver ROS, GERD  ,  Endo/Other  negative endocrine ROSneg diabetes  Renal/GU negative Renal ROS     Musculoskeletal negative musculoskeletal ROS (+)   Abdominal (+) - obese,   Peds  Hematology negative hematology ROS (+)   Anesthesia Other Findings Past Medical History: No date: Allergy 07/30/2011: Generalized anxiety disorder No date: GERD (gastroesophageal reflux disease) No date: Migraine 07/30/2011: Panic attacks   Reproductive/Obstetrics                            Anesthesia Physical Anesthesia Plan  ASA: II  Anesthesia Plan: General   Post-op Pain Management:    Induction: Intravenous  PONV Risk Score and Plan: 0 and Ondansetron and Midazolam  Airway Management Planned: Oral ETT  Additional Equipment:   Intra-op Plan:   Post-operative Plan: Extubation in OR  Informed Consent: I have reviewed the patients History and Physical, chart, labs and discussed the procedure including the risks, benefits and alternatives for the proposed anesthesia with the patient or authorized representative who has indicated his/her understanding and acceptance.    Dental advisory given  Plan Discussed with: CRNA and Anesthesiologist  Anesthesia Plan Comments:         Anesthesia Quick Evaluation

## 2018-03-17 NOTE — Consult Note (Addendum)
[facility] SURGICAL CONSULTATION NOTE (initial) - cpt: 00370   HISTORY OF PRESENT ILLNESS (HPI):  53 y.o. male presented to [facility] ED on 03/15/18 for evaluation of abdominal pain. Patient reported several days of abdominal pain prior to presentation. He noticed the pain would onset during the night and would wake him up from sleep. He tries taking Pepto-Bismol and Mylanta which helps resolve his symptoms after a few hours. He denied any associated nausea or emesis. Unsure if the pain is associated with food. Does take BC powder. No additional complaints of fever, chills, chest pain, SOB, bowel changes, urinary changes, or peripheral edema. Work up in the ED was concerning for elevated bilirubin and LFTs and subsequent work up in the hospital revealed cholelithiasis without cholecystitis or choledocholithiasis.   Today, he still reports mild epigastric tenderness but this is improved from his presentation. He denied any associated fever, chills, nausea, or emesis. No history of pain similar to this in the past.   Surgery is consulted by hospitalist physician Dr. Amado Coe in this context for evaluation and management of cholelithiasis.   PAST MEDICAL HISTORY (PMH):  Past Medical History:  Diagnosis Date  . Allergy   . Generalized anxiety disorder 07/30/2011  . GERD (gastroesophageal reflux disease)   . Migraine   . Panic attacks 07/30/2011     PAST SURGICAL HISTORY [facility]):  Past Surgical History:  Procedure Laterality Date  . HERNIA REPAIR    . TONSILLECTOMY AND ADENOIDECTOMY       MEDICATIONS:  Prior to Admission medications   Medication Sig Start Date End Date Taking? Authorizing Provider  citalopram (CELEXA) 20 MG tablet TAKE 1 TABLET BY MOUTH EVERY DAY 09/26/17  Yes Copland, Karleen Hampshire, MD  pantoprazole (PROTONIX) 40 MG tablet TAKE 1 TABLET BY MOUTH EVERY DAY 09/26/17  Yes Copland, Karleen Hampshire, MD  SUMAtriptan (IMITREX) 100 MG tablet TAKE 1 TABLET BY MOUTH AS NEEDED FOR MIGRAINE.  MAY REPEAT IN 2 HRS IF NEEDED. MAX 2TABS/DAY 03/17/18   Hannah Beat, MD     ALLERGIES:  No Known Allergies   SOCIAL HISTORY:  Social History   Socioeconomic History  . Marital status: Married    Spouse name: Not on file  . Number of children: 2  . Years of education: Not on file  . Highest education level: Not on file  Occupational History  . Occupation: injection mold setup machinery   Social Needs  . Financial resource strain: Not on file  . Food insecurity:    Worry: Not on file    Inability: Not on file  . Transportation needs:    Medical: Not on file    Non-medical: Not on file  Tobacco Use  . Smoking status: Current Every Day Smoker    Packs/day: 0.50    Types: Cigarettes  . Smokeless tobacco: Former Engineer, water and Sexual Activity  . Alcohol use: Yes    Alcohol/week: 0.0 standard drinks    Comment: occ  . Drug use: No  . Sexual activity: Not on file  Lifestyle  . Physical activity:    Days per week: Not on file    Minutes per session: Not on file  . Stress: Not on file  Relationships  . Social connections:    Talks on phone: Not on file    Gets together: Not on file    Attends religious service: Not on file    Active member of club or organization: Not on file    Attends meetings of clubs  or organizations: Not on file    Relationship status: Not on file  . Intimate partner violence:    Fear of current or ex partner: Not on file    Emotionally abused: Not on file    Physically abused: Not on file    Forced sexual activity: Not on file  Other Topics Concern  . Not on file  Social History Narrative   1 daily caffiene use   Regular exercise-walking           FAMILY HISTORY:  Family History  Problem Relation Age of Onset  . Hypertension Father   . Heart disease Paternal Grandfather   . Hyperlipidemia Paternal Grandfather   . Diabetes Paternal Grandfather   . Hyperlipidemia Paternal Grandmother   . Diabetes Paternal Grandmother        REVIEW OF SYSTEMS:  Review of Systems  Constitutional: Negative for chills and fever.  Respiratory: Negative for cough and sputum production.   Cardiovascular: Negative for chest pain and palpitations.  Gastrointestinal: Positive for abdominal pain. Negative for blood in stool, diarrhea, nausea and vomiting.  Genitourinary: Negative for dysuria and urgency.  Musculoskeletal: Negative for joint pain and myalgias.  Neurological: Negative for dizziness and headaches.  All other systems reviewed and are negative.   VITAL SIGNS:  Temp:  [98.5 F (36.9 C)-98.8 F (37.1 C)] 98.8 F (37.1 C) (01/03 0256) Pulse Rate:  [81-96] 96 (01/03 0256) Resp:  [16-17] 16 (01/03 0256) BP: (113-116)/(75-80) 116/80 (01/03 0256) SpO2:  [95 %-97 %] 95 % (01/03 0256)     Height: 5\' 8"  (172.7 cm) Weight: 81.6 kg BMI (Calculated): 27.38   INTAKE/OUTPUT:  This shift: Total I/O In: 324.9 [I.V.:324.9] Out: -   Last 2 shifts: @IOLAST2SHIFTS @   PHYSICAL EXAM:  Physical Exam Constitutional:      General: He is not in acute distress.    Appearance: He is well-developed. He is not ill-appearing.  HENT:     Head: Normocephalic and atraumatic.  Eyes:     General: No scleral icterus.    Conjunctiva/sclera: Conjunctivae normal.  Cardiovascular:     Rate and Rhythm: Normal rate and regular rhythm.     Pulses: Normal pulses.     Heart sounds: No murmur. No friction rub. No gallop.   Pulmonary:     Effort: Pulmonary effort is normal. No respiratory distress.     Breath sounds: Normal breath sounds. No wheezing or rhonchi.  Abdominal:     General: Abdomen is flat. There is no distension.     Tenderness: There is abdominal tenderness in the right upper quadrant and epigastric area. There is no guarding or rebound. Negative signs include Murphy's sign.  Skin:    General: Skin is warm and dry.     Coloration: Skin is not jaundiced or pale.  Neurological:     General: No focal deficit present.     Mental  Status: He is alert and oriented to person, place, and time.  Psychiatric:        Mood and Affect: Mood normal.        Behavior: Behavior normal.       Labs:  CBC Latest Ref Rng & Units 03/16/2018 03/15/2018 07/29/2017  WBC 4.0 - 10.5 K/uL 7.0 7.4 6.7  Hemoglobin 13.0 - 17.0 g/dL 50.2 77.4 12.8  Hematocrit 39.0 - 52.0 % 44.4 46.0 40.1  Platelets 150 - 400 K/uL 173 183 187   CMP Latest Ref Rng & Units 03/16/2018 03/15/2018 07/29/2017  Glucose 70 -  99 mg/dL 562(Z108(H) 97 92  BUN 6 - 20 mg/dL 10 13 14   Creatinine 0.61 - 1.24 mg/dL 3.080.73 6.570.89 8.461.00  Sodium 135 - 145 mmol/L 136 139 138  Potassium 3.5 - 5.1 mmol/L 3.8 3.9 3.9  Chloride 98 - 111 mmol/L 105 106 105  CO2 22 - 32 mmol/L 25 26 28   Calcium 8.9 - 10.3 mg/dL 9.6(E8.6(L) 8.9 9.2  Total Protein 6.5 - 8.1 g/dL 6.4(L) 7.1 6.5  Total Bilirubin 0.3 - 1.2 mg/dL 6.2(H) 3.2(H) 0.3  Alkaline Phos 38 - 126 U/L 139(H) 134(H) -  AST 15 - 41 U/L 290(H) 567(H) 21  ALT 0 - 44 U/L 345(H) 432(H) 26    Imaging studies:   -- HIDA Scan on 03/16/18:  IMPRESSION: 1. Patent cystic duct without evidence for acute cholecystitis. 2. Delayed and diminished biliary to bowel transit rays is noted. This raises suspicion of partial obstruction of the common bile duct. Alternatively, findings may be due to severe hepatocellular dysfunction.  -- MRCP on 03/16/18:  IMPRESSION: 1. Mild pancreatic and peripancreatic edema within the pancreatic neck and proximal body, suggesting mild acute uncomplicated pancreatitis. Suggest correlation with serum lipase. 2. Cholelithiasis. Mild diffuse gallbladder wall thickening. No gallbladder distention or pericholecystic fluid. 3. No biliary ductal dilatation. CBD diameter 4 mm. No evidence of choledocholithiasis.   Assessment/Plan: (ICD-10's: 50K80.80) 53 y.o. male with hyperbilirubinemia and mild pancreatitis, most likely secondary to cholelithiasis and passed CBD stone, complicated by pertinent comorbidities including GERD, Anxiety,  and current tobacco abuse.   - Appreciate GI input, no evidence of current choledocholithiasis. EGD today for epigastric pain (evaluate for ulcer).  - Patient will benefit from cholecystectomy for presumed gallstone pancreatitis. Scheduled for later today.  - All risks, benefits, and alternatives to above procedure were discussed with the patient and his family, all of their questions were answered to their expressed satisfaction, patient expresses he wishes to proceed, and informed consent was obtained  - NPO + IVF  - Monitor CBC, LFTs, Lipase  - Pain control as needed (minimize narcotics)  - Continue to monitor abdominal examination and on-going bowel function.   - Medical management per primary team   All of the above findings and recommendations were discussed with the patient and his family, and all of patient's and his family's questions were answered to their expressed satisfaction.  Thank you for the opportunity to participate in this patient's care.   -- Lynden OxfordZachary Schulz, PA-C [facility] 03/17/2018, 9:42 AM [phone] M-F: 7am - 4pm  I saw and evaluated the patient.  I agree with the above documentation, exam, and plan, which I have edited where appropriate. Duanne GuessJennifer Isamu Trammel  1:05 PM  03/17/18

## 2018-03-17 NOTE — Transfer of Care (Signed)
Immediate Anesthesia Transfer of Care Note  Patient: David Sheppard  Procedure(s) Performed: LAPAROSCOPIC CHOLECYSTECTOMY (N/A )  Patient Location: PACU  Anesthesia Type:General  Level of Consciousness: awake, alert  and oriented  Airway & Oxygen Therapy: Patient Spontanous Breathing and Patient connected to nasal cannula oxygen  Post-op Assessment: Report given to RN and Post -op Vital signs reviewed and stable  Post vital signs: Reviewed and stable  Last Vitals:  Vitals Value Taken Time  BP    Temp    Pulse 88 03/17/2018  3:01 PM  Resp 18 03/17/2018  3:01 PM  SpO2 99 % 03/17/2018  3:01 PM  Vitals shown include unvalidated device data.  Last Pain:  Vitals:   03/17/18 1217  TempSrc: Temporal  PainSc: 0-No pain      Patients Stated Pain Goal: 0 (03/16/18 1418)  Complications: No apparent anesthesia complications

## 2018-03-17 NOTE — Anesthesia Post-op Follow-up Note (Signed)
Anesthesia QCDR form completed.        

## 2018-03-17 NOTE — Progress Notes (Signed)
[facility] [place] [facility]   PATIENT NAME: David HazelJeffrey Klinedinst    MR#:  [id number]  DATE OF BIRTH:  1965/03/25  SUBJECTIVE:  CHIEF COMPLAINT: Patient's abdominal pain is better but having head ache   Family at bedside.  REVIEW OF SYSTEMS:  CONSTITUTIONAL: No fever, fatigue or weakness.  EYES: No blurred or double vision.  EARS, NOSE, AND THROAT: No tinnitus or ear pain.  RESPIRATORY: No cough, shortness of breath, wheezing or hemoptysis.  CARDIOVASCULAR: No chest pain, orthopnea, edema.  GASTROINTESTINAL: No nausea, vomiting, diarrhea.  Reporting epigastric and right-sided abdominal pain.  GENITOURINARY: No dysuria, hematuria.  ENDOCRINE: No polyuria, nocturia,  HEMATOLOGY: No anemia, easy bruising or bleeding SKIN: No rash or lesion. MUSCULOSKELETAL: No joint pain or arthritis.   NEUROLOGIC: No tingling, numbness, weakness.  PSYCHIATRY: No anxiety or depression.   DRUG ALLERGIES:  No Known Allergies  VITALS:  Blood pressure 106/74, pulse 88, temperature 99.1 F (37.3 C), resp. rate 18, height 5\' 8"  (1.727 m), weight 81.6 kg, SpO2 99 %.  PHYSICAL EXAMINATION:  GENERAL:  53 y.o.-year-old patient lying in the bed with no acute distress.  EYES: Pupils equal, round, reactive to light and accommodation. No scleral icterus. Extraocular muscles intact.  HEENT: Head atraumatic, normocephalic. Oropharynx and nasopharynx clear.  NECK:  Supple, no jugular venous distention. No thyroid enlargement, no tenderness.  LUNGS: Normal breath sounds bilaterally, no wheezing, rales,rhonchi or crepitation. No use of accessory muscles of respiration.  CARDIOVASCULAR: S1, S2 normal. No murmurs, rubs, or gallops.  ABDOMEN: Soft, epigastric and right-sided tenderness is present nondistended. Bowel sounds present  EXTREMITIES: No pedal edema, cyanosis, or clubbing.  NEUROLOGIC: Awake, alert and oriented x3 sensation intact. Gait not checked.  PSYCHIATRIC: The patient is  alert and oriented x 3.  SKIN: No obvious rash, lesion, or ulcer.    LABORATORY PANEL:   CBC Recent Labs  Lab 03/17/18 1005  WBC 11.1*  HGB 14.9  HCT 43.9  PLT 176   ------------------------------------------------------------------------------------------------------------------  Chemistries  Recent Labs  Lab 03/17/18 1005  NA 136  K 4.3  CL 104  CO2 25  GLUCOSE 109*  BUN 9  CREATININE 0.71  CALCIUM 8.6*  AST 107*  ALT 255*  ALKPHOS 183*  BILITOT 2.3*   ------------------------------------------------------------------------------------------------------------------  Cardiac Enzymes Recent Labs  Lab 03/15/18 1829  TROPONINI <0.03   ------------------------------------------------------------------------------------------------------------------  RADIOLOGY:  Nm Hepatobiliary Liver Func  Result Date: 03/16/2018 CLINICAL DATA:  Nausea with pain for 1-2 weeks. EXAM: NUCLEAR MEDICINE HEPATOBILIARY IMAGING TECHNIQUE: Sequential images of the abdomen were obtained out to 60 minutes following intravenous administration of radiopharmaceutical. RADIOPHARMACEUTICALS:  7.4 mCi Tc-2510m  Choletec IV COMPARISON:  None FINDINGS: There is prompt uptake of the radiopharmaceutical by the liver. In the first hour there is very mild radiotracer accumulation within the gallbladder. In the second hour radiotracer accumulation within the gallbladder becomes more apparent. No biliary to bowel transit identified within the first or second hour. Delayed imaging was obtained at 2 hours and 30 minutes which shows radiotracer activity in the small bowel. Persistent intense radiotracer activity noted throughout the liver up to 2 hours and 60 minutes. IMPRESSION: 1. Patent cystic duct without evidence for acute cholecystitis. 2. Delayed and diminished biliary to bowel transit rays is noted. This raises suspicion of partial obstruction of the common bile duct. Alternatively, findings may be due to  severe hepatocellular dysfunction. Electronically Signed   By: Signa Kellaylor  Stroud M.D.   On: 03/16/2018 14:40  Mr 3d Recon At Scanner  Result Date: 03/16/2018 CLINICAL DATA:  Epigastric abdominal pain for 1.5 weeks. Abnormal liver function tests with rising elevated total bilirubin. EXAM: MRI ABDOMEN WITHOUT AND WITH CONTRAST (INCLUDING MRCP) TECHNIQUE: Multiplanar multisequence MR imaging of the abdomen was performed both before and after the administration of intravenous contrast. Heavily T2-weighted images of the biliary and pancreatic ducts were obtained, and three-dimensional MRCP images were rendered by post processing. CONTRAST:  7.5 cc Gadavist IV. COMPARISON:  03/16/2018 hepatobiliary scintigraphy study. FINDINGS: Lower chest: Mild hypoventilatory changes at the dependent lung bases. Hepatobiliary: Normal liver size and configuration. No hepatic steatosis. No liver mass. Subcentimeter layering gallstone in the nondistended gallbladder. Mild diffuse gallbladder wall thickening. No pericholecystic fluid. No biliary ductal dilatation. Common bile duct diameter 4 mm. No filling defects within the biliary tree. No biliary strictures or masses. Pancreas: There is mild pancreatic and peripancreatic edema within the pancreatic neck and proximal body, suggesting mild acute pancreatitis. No peripancreatic fluid collections. Preserved pancreatic parenchymal enhancement. No pancreatic mass or duct dilation. No pancreas divisum. Spleen: Normal size. No mass. Adrenals/Urinary Tract: Normal adrenals. No hydronephrosis. Normal kidneys with no renal mass. Stomach/Bowel: Normal non-distended stomach. Visualized small and large bowel is normal caliber, with no bowel wall thickening. Vascular/Lymphatic: Normal caliber abdominal aorta. Patent portal, splenic, hepatic and renal veins. No pathologically enlarged lymph nodes in the abdomen. Other: No abdominal ascites or focal fluid collection. Musculoskeletal: No aggressive  appearing focal osseous lesions. IMPRESSION: 1. Mild pancreatic and peripancreatic edema within the pancreatic neck and proximal body, suggesting mild acute uncomplicated pancreatitis. Suggest correlation with serum lipase. 2. Cholelithiasis. Mild diffuse gallbladder wall thickening. No gallbladder distention or pericholecystic fluid. 3. No biliary ductal dilatation. CBD diameter 4 mm. No evidence of choledocholithiasis. Electronically Signed   By: Delbert Phenix M.D.   On: 03/16/2018 21:31   Mr Abdomen Mrcp Vivien Rossetti Contast  Result Date: 03/16/2018 CLINICAL DATA:  Epigastric abdominal pain for 1.5 weeks. Abnormal liver function tests with rising elevated total bilirubin. EXAM: MRI ABDOMEN WITHOUT AND WITH CONTRAST (INCLUDING MRCP) TECHNIQUE: Multiplanar multisequence MR imaging of the abdomen was performed both before and after the administration of intravenous contrast. Heavily T2-weighted images of the biliary and pancreatic ducts were obtained, and three-dimensional MRCP images were rendered by post processing. CONTRAST:  7.5 cc Gadavist IV. COMPARISON:  03/16/2018 hepatobiliary scintigraphy study. FINDINGS: Lower chest: Mild hypoventilatory changes at the dependent lung bases. Hepatobiliary: Normal liver size and configuration. No hepatic steatosis. No liver mass. Subcentimeter layering gallstone in the nondistended gallbladder. Mild diffuse gallbladder wall thickening. No pericholecystic fluid. No biliary ductal dilatation. Common bile duct diameter 4 mm. No filling defects within the biliary tree. No biliary strictures or masses. Pancreas: There is mild pancreatic and peripancreatic edema within the pancreatic neck and proximal body, suggesting mild acute pancreatitis. No peripancreatic fluid collections. Preserved pancreatic parenchymal enhancement. No pancreatic mass or duct dilation. No pancreas divisum. Spleen: Normal size. No mass. Adrenals/Urinary Tract: Normal adrenals. No hydronephrosis. Normal kidneys  with no renal mass. Stomach/Bowel: Normal non-distended stomach. Visualized small and large bowel is normal caliber, with no bowel wall thickening. Vascular/Lymphatic: Normal caliber abdominal aorta. Patent portal, splenic, hepatic and renal veins. No pathologically enlarged lymph nodes in the abdomen. Other: No abdominal ascites or focal fluid collection. Musculoskeletal: No aggressive appearing focal osseous lesions. IMPRESSION: 1. Mild pancreatic and peripancreatic edema within the pancreatic neck and proximal body, suggesting mild acute uncomplicated pancreatitis. Suggest correlation with serum lipase. 2. Cholelithiasis. Mild  diffuse gallbladder wall thickening. No gallbladder distention or pericholecystic fluid. 3. No biliary ductal dilatation. CBD diameter 4 mm. No evidence of choledocholithiasis. Electronically Signed   By: Delbert Phenix M.D.   On: 03/16/2018 21:31    EKG:   Orders placed or performed during the hospital encounter of 03/15/18  . EKG 12-Lead  . EKG 12-Lead  . ED EKG within 10 minutes  . ED EKG within 10 minutes  . EKG    ASSESSMENT AND PLAN:    1.  Acute right upper quadrant and Epigastric abdominal pain. N.p.o. HIDA scan is abnormal with concern for partial obstruction of CBD.  Patent cystic duct MRCP ordered, no choledocholithiasis, has cholelithiasis Surgery consult placed for lap chole today by Dr. Lady Gary  2.  History of peptic ulcer disease with bleeding ulcers per previous EGD Patient was instructed not to take Goody powders PPI Avoid NSAIDs GI is following.  Patient is for EGD today  3.  History of migraine headaches -takes Imitrex  # .  Anxiety on Celexa  #Tobacco abuse disorder counseled patient to quit smoking for 5 minutes.  He verbalized understanding of the plan.  Nicotine patch ordered   All the records are reviewed and case discussed with Care Management/Social Workerr. Management plans discussed with the patient, family and they are in  agreement.  CODE STATUS: FC   TOTAL TIME TAKING CARE OF THIS PATIENT: 38 minutes.   POSSIBLE D/C IN 1-2  DAYS, DEPENDING ON CLINICAL CONDITION.  Note: This dictation was prepared with Dragon dictation along with smaller phrase technology. Any transcriptional errors that result from this process are unintentional.   Ramonita Lab M.D on 03/17/2018 at 3:13 PM  Between 7am to 6pm - Pager - [phone] After 6pm go to [email or web address] - password [email or web address] [facility]  [place] [facility]  Office  [phone]  CC: Primary care physician; Hannah Beat, MD

## 2018-03-17 NOTE — Brief Op Note (Signed)
03/17/2018  3:03 PM  PATIENT:  Queen SloughJeffrey V Juarez  53 y.o. male  PRE-OPERATIVE DIAGNOSIS:  Gallstones, gallstone pancreatitis  POST-OPERATIVE DIAGNOSIS:  Gallstones, gallstone pancreatitis  PROCEDURE:  Procedure(s): LAPAROSCOPIC CHOLECYSTECTOMY (N/A)  SURGEON:  Surgeon(s) and Role:    Duanne Guess* Nakesha Ebrahim, MD - Primary  PHYSICIAN ASSISTANT:   ASSISTANTS: none   ANESTHESIA:   general  EBL:  10 mL   BLOOD ADMINISTERED:none  DRAINS: none   LOCAL MEDICATIONS USED:  OTHER 1: mixture of 0.25% bupivicaine and 1% lidocaine with epi  SPECIMEN:  Source of Specimen:  gallbladder  DISPOSITION OF SPECIMEN:  PATHOLOGY  COUNTS:  YES  TOURNIQUET:  * No tourniquets in log *  DICTATION: .Note written in EPIC  PLAN OF CARE: return to inpatient bed  PATIENT DISPOSITION:  PACU - hemodynamically stable.   Delay start of Pharmacological VTE agent (>24hrs) due to surgical blood loss or risk of bleeding: not applicable

## 2018-03-18 DIAGNOSIS — Z833 Family history of diabetes mellitus: Secondary | ICD-10-CM | POA: Diagnosis not present

## 2018-03-18 DIAGNOSIS — G43909 Migraine, unspecified, not intractable, without status migrainosus: Secondary | ICD-10-CM | POA: Diagnosis present

## 2018-03-18 DIAGNOSIS — Z8711 Personal history of peptic ulcer disease: Secondary | ICD-10-CM | POA: Diagnosis not present

## 2018-03-18 DIAGNOSIS — K851 Biliary acute pancreatitis without necrosis or infection: Secondary | ICD-10-CM | POA: Diagnosis present

## 2018-03-18 DIAGNOSIS — K219 Gastro-esophageal reflux disease without esophagitis: Secondary | ICD-10-CM | POA: Diagnosis present

## 2018-03-18 DIAGNOSIS — F17210 Nicotine dependence, cigarettes, uncomplicated: Secondary | ICD-10-CM | POA: Diagnosis present

## 2018-03-18 DIAGNOSIS — Z8249 Family history of ischemic heart disease and other diseases of the circulatory system: Secondary | ICD-10-CM | POA: Diagnosis not present

## 2018-03-18 DIAGNOSIS — K8062 Calculus of gallbladder and bile duct with acute cholecystitis without obstruction: Secondary | ICD-10-CM | POA: Diagnosis present

## 2018-03-18 DIAGNOSIS — Z79899 Other long term (current) drug therapy: Secondary | ICD-10-CM | POA: Diagnosis not present

## 2018-03-18 DIAGNOSIS — F410 Panic disorder [episodic paroxysmal anxiety] without agoraphobia: Secondary | ICD-10-CM | POA: Diagnosis present

## 2018-03-18 DIAGNOSIS — R1013 Epigastric pain: Secondary | ICD-10-CM | POA: Diagnosis present

## 2018-03-18 DIAGNOSIS — R109 Unspecified abdominal pain: Secondary | ICD-10-CM | POA: Diagnosis present

## 2018-03-18 DIAGNOSIS — F411 Generalized anxiety disorder: Secondary | ICD-10-CM | POA: Diagnosis present

## 2018-03-18 LAB — LIPASE, BLOOD: Lipase: 85 U/L — ABNORMAL HIGH (ref 11–51)

## 2018-03-18 LAB — COMPREHENSIVE METABOLIC PANEL
ALT: 191 U/L — ABNORMAL HIGH (ref 0–44)
AST: 84 U/L — ABNORMAL HIGH (ref 15–41)
Albumin: 3.3 g/dL — ABNORMAL LOW (ref 3.5–5.0)
Alkaline Phosphatase: 161 U/L — ABNORMAL HIGH (ref 38–126)
Anion gap: 5 (ref 5–15)
BILIRUBIN TOTAL: 1.4 mg/dL — AB (ref 0.3–1.2)
BUN: 10 mg/dL (ref 6–20)
CALCIUM: 8.1 mg/dL — AB (ref 8.9–10.3)
CO2: 25 mmol/L (ref 22–32)
Chloride: 107 mmol/L (ref 98–111)
Creatinine, Ser: 0.76 mg/dL (ref 0.61–1.24)
GFR calc Af Amer: >60 mL/min (ref >60)
GFR calc non Af Amer: >60 mL/min (ref >60)
Glucose, Bld: 100 mg/dL — ABNORMAL HIGH (ref 70–99)
Potassium: 4.1 mmol/L (ref 3.5–5.1)
Sodium: 137 mmol/L (ref 135–145)
Total Protein: 6.3 g/dL — ABNORMAL LOW (ref 6.5–8.1)

## 2018-03-18 LAB — CBC
HCT: 40.1 % (ref 39.0–52.0)
Hemoglobin: 13.4 g/dL (ref 13.0–17.0)
MCH: 32.6 pg (ref 26.0–34.0)
MCHC: 33.4 g/dL (ref 30.0–36.0)
MCV: 97.6 fL (ref 80.0–100.0)
Platelets: 155 10*3/uL (ref 150–400)
RBC: 4.11 MIL/uL — ABNORMAL LOW (ref 4.22–5.81)
RDW: 11.9 % (ref 11.5–15.5)
WBC: 9.5 10*3/uL (ref 4.0–10.5)
nRBC: 0 % (ref 0.0–0.2)

## 2018-03-18 MED ORDER — TRAMADOL HCL 50 MG PO TABS: 50.0000 mg | ORAL_TABLET | Freq: Four times a day (QID) | ORAL | 0 refills | Status: DC | PRN

## 2018-03-18 NOTE — Progress Notes (Signed)
David Bouillon, MD [address], [address], [place], [place], [address] [address], [address], [place], [place], [address] Phone: [phone]  Fax: [phone]   Subjective: Patient is status post cholecystectomy yesterday.  Abdominal pain improved significantly.  No nausea or vomiting.   Objective: Exam: Vital signs in last 24 hours: Vitals:   03/17/18 1717 03/17/18 2112 03/17/18 2114 03/18/18 0518  BP: 110/68  100/65 116/80  Pulse: 84 82 82 82  Resp: 16   18  Temp: 98.6 F (37 C) 98.5 F (36.9 C)  97.6 F (36.4 C)  TempSrc: Oral Oral  Oral  SpO2: 97% 95%  95%  Weight:      Height:       Weight change:   Intake/Output Summary (Last 24 hours) at 03/18/2018 0949 Last data filed at 03/18/2018 0518 Gross per 24 hour  Intake 1220 ml  Output 10 ml  Net 1210 ml    General: No acute distress, AAO x3 Abd: Soft, NT/ND, No HSM Skin: Warm, no rashes Neck: Supple, Trachea midline   Lab Results: Lab Results  Component Value Date   WBC 9.5 03/18/2018   HGB 13.4 03/18/2018   HCT 40.1 03/18/2018   MCV 97.6 03/18/2018   PLT 155 03/18/2018   Micro Results: No results found for this or any previous visit (from the past 240 hour(s)). Studies/Results: Nm Hepatobiliary Liver Func  Result Date: 03/16/2018 CLINICAL DATA:  Nausea with pain for 1-2 weeks. EXAM: NUCLEAR MEDICINE HEPATOBILIARY IMAGING TECHNIQUE: Sequential images of the abdomen were obtained out to 60 minutes following intravenous administration of radiopharmaceutical. RADIOPHARMACEUTICALS:  7.4 mCi Tc-24m  Choletec IV COMPARISON:  None FINDINGS: There is prompt uptake of the radiopharmaceutical by the liver. In the first hour there is very mild radiotracer accumulation within the gallbladder. In the second hour radiotracer accumulation within the gallbladder becomes more apparent. No biliary to bowel transit identified within the first or second hour. Delayed imaging was obtained at 2 hours and 30 minutes which  shows radiotracer activity in the small bowel. Persistent intense radiotracer activity noted throughout the liver up to 2 hours and 60 minutes. IMPRESSION: 1. Patent cystic duct without evidence for acute cholecystitis. 2. Delayed and diminished biliary to bowel transit rays is noted. This raises suspicion of partial obstruction of the common bile duct. Alternatively, findings may be due to severe hepatocellular dysfunction. Electronically Signed   By: Signa Kell M.D.   On: 03/16/2018 14:40   Mr 3d Recon At Scanner  Result Date: 03/16/2018 CLINICAL DATA:  Epigastric abdominal pain for 1.5 weeks. Abnormal liver function tests with rising elevated total bilirubin. EXAM: MRI ABDOMEN WITHOUT AND WITH CONTRAST (INCLUDING MRCP) TECHNIQUE: Multiplanar multisequence MR imaging of the abdomen was performed both before and after the administration of intravenous contrast. Heavily T2-weighted images of the biliary and pancreatic ducts were obtained, and three-dimensional MRCP images were rendered by post processing. CONTRAST:  7.5 cc Gadavist IV. COMPARISON:  03/16/2018 hepatobiliary scintigraphy study. FINDINGS: Lower chest: Mild hypoventilatory changes at the dependent lung bases. Hepatobiliary: Normal liver size and configuration. No hepatic steatosis. No liver mass. Subcentimeter layering gallstone in the nondistended gallbladder. Mild diffuse gallbladder wall thickening. No pericholecystic fluid. No biliary ductal dilatation. Common bile duct diameter 4 mm. No filling defects within the biliary tree. No biliary strictures or masses. Pancreas: There is mild pancreatic and peripancreatic edema within the pancreatic neck and proximal body, suggesting mild acute pancreatitis. No peripancreatic fluid collections. Preserved pancreatic parenchymal enhancement. No pancreatic  mass or duct dilation. No pancreas divisum. Spleen: Normal size. No mass. Adrenals/Urinary Tract: Normal adrenals. No hydronephrosis. Normal kidneys  with no renal mass. Stomach/Bowel: Normal non-distended stomach. Visualized small and large bowel is normal caliber, with no bowel wall thickening. Vascular/Lymphatic: Normal caliber abdominal aorta. Patent portal, splenic, hepatic and renal veins. No pathologically enlarged lymph nodes in the abdomen. Other: No abdominal ascites or focal fluid collection. Musculoskeletal: No aggressive appearing focal osseous lesions. IMPRESSION: 1. Mild pancreatic and peripancreatic edema within the pancreatic neck and proximal body, suggesting mild acute uncomplicated pancreatitis. Suggest correlation with serum lipase. 2. Cholelithiasis. Mild diffuse gallbladder wall thickening. No gallbladder distention or pericholecystic fluid. 3. No biliary ductal dilatation. CBD diameter 4 mm. No evidence of choledocholithiasis. Electronically Signed   By: Delbert PhenixJason A Poff M.D.   On: 03/16/2018 21:31   Mr Abdomen Mrcp Vivien RossettiW Wo Contast  Result Date: 03/16/2018 CLINICAL DATA:  Epigastric abdominal pain for 1.5 weeks. Abnormal liver function tests with rising elevated total bilirubin. EXAM: MRI ABDOMEN WITHOUT AND WITH CONTRAST (INCLUDING MRCP) TECHNIQUE: Multiplanar multisequence MR imaging of the abdomen was performed both before and after the administration of intravenous contrast. Heavily T2-weighted images of the biliary and pancreatic ducts were obtained, and three-dimensional MRCP images were rendered by post processing. CONTRAST:  7.5 cc Gadavist IV. COMPARISON:  03/16/2018 hepatobiliary scintigraphy study. FINDINGS: Lower chest: Mild hypoventilatory changes at the dependent lung bases. Hepatobiliary: Normal liver size and configuration. No hepatic steatosis. No liver mass. Subcentimeter layering gallstone in the nondistended gallbladder. Mild diffuse gallbladder wall thickening. No pericholecystic fluid. No biliary ductal dilatation. Common bile duct diameter 4 mm. No filling defects within the biliary tree. No biliary strictures or masses.  Pancreas: There is mild pancreatic and peripancreatic edema within the pancreatic neck and proximal body, suggesting mild acute pancreatitis. No peripancreatic fluid collections. Preserved pancreatic parenchymal enhancement. No pancreatic mass or duct dilation. No pancreas divisum. Spleen: Normal size. No mass. Adrenals/Urinary Tract: Normal adrenals. No hydronephrosis. Normal kidneys with no renal mass. Stomach/Bowel: Normal non-distended stomach. Visualized small and large bowel is normal caliber, with no bowel wall thickening. Vascular/Lymphatic: Normal caliber abdominal aorta. Patent portal, splenic, hepatic and renal veins. No pathologically enlarged lymph nodes in the abdomen. Other: No abdominal ascites or focal fluid collection. Musculoskeletal: No aggressive appearing focal osseous lesions. IMPRESSION: 1. Mild pancreatic and peripancreatic edema within the pancreatic neck and proximal body, suggesting mild acute uncomplicated pancreatitis. Suggest correlation with serum lipase. 2. Cholelithiasis. Mild diffuse gallbladder wall thickening. No gallbladder distention or pericholecystic fluid. 3. No biliary ductal dilatation. CBD diameter 4 mm. No evidence of choledocholithiasis. Electronically Signed   By: Delbert PhenixJason A Poff M.D.   On: 03/16/2018 21:31   Medications:  Scheduled Meds: . citalopram  20 mg Oral QHS  . pantoprazole (PROTONIX) IV  40 mg Intravenous Q12H   Continuous Infusions: PRN Meds:.acetaminophen, alum & mag hydroxide-simeth, nicotine, ondansetron **OR** ondansetron (ZOFRAN) IV, oxyCODONE, SUMAtriptan, traMADol   Assessment: Active Problems:   Epigastric pain   Increased liver enzymes   Gallstone pancreatitis    Plan: Liver enzymes have improved since his cholecystectomy Continue to avoid hepatotoxic drugs Continue daily CMP Abdominal pain was due to cholecystitis and as expected is improving after cholecystectomy  No indication for EGD at this time No episodes of  bleeding Hemoglobin stable Can change Protonix to p.o. once daily for 2 weeks and then discontinue  Patient should follow-up with GI as an outpatient due to his history of duodenal ulcers and to evaluate  if he needs an EGD in the future.  Patient was given the number to our GI clinic and was asked to call on Monday to make an appointment and he verbalized understanding.  He was educated on avoiding hepatotoxic drugs including alcohol, oral products or supplements he verbalized understanding.  He only uses Tylenol occasionally and was advised not to use more than 1 to 2 pills of Tylenol a day.  He continues to avoid NSAIDs and this was encouraged.    LOS: 0 days   David BouillonVarnita Zenna Traister, MD 03/18/2018, 9:49 AM

## 2018-03-18 NOTE — Discharge Instructions (Signed)
May return to work when you feel up to it and when you are no longer on narcotic pain medicine  No lifting more than 10lbs for 6 weeks.  May shower 48 hours after surgery.  Do not scrub incisions, let water run over and pat dry.  Do not submerge in bath, pool, hot tub etc.

## 2018-03-18 NOTE — Progress Notes (Signed)
03/18/2018 11:33 AM  [place] to be D/C'd Home per MD order.  Discussed prescriptions and follow up appointments with the patient. Prescriptions given to patient, medication list explained in detail. Pt verbalized understanding.  Allergies as of 03/18/2018   No Known Allergies     Medication List    TAKE these medications   citalopram 20 MG tablet Commonly known as:  CELEXA TAKE 1 TABLET BY MOUTH EVERY DAY   pantoprazole 40 MG tablet Commonly known as:  PROTONIX TAKE 1 TABLET BY MOUTH EVERY DAY   SUMAtriptan 100 MG tablet Commonly known as:  IMITREX TAKE 1 TABLET BY MOUTH AS NEEDED FOR MIGRAINE. MAY REPEAT IN 2 HRS IF NEEDED. MAX 2TABS/DAY   traMADol 50 MG tablet Commonly known as:  ULTRAM Take 1 tablet (50 mg total) by mouth every 6 (six) hours as needed for moderate pain (MILD -MOD PAIN, HEADACHE).       Vitals:   03/17/18 2114 03/18/18 0518  BP: 100/65 116/80  Pulse: 82 82  Resp:  18  Temp:  97.6 F (36.4 C)  SpO2:  95%    Skin clean, dry and intact without evidence of skin break down, no evidence of skin tears noted. IV catheter discontinued intact. Site without signs and symptoms of complications. Dressing and pressure applied. Pt denies pain at this time. No complaints noted.  An After Visit Summary was printed and given to the patient. Patient escorted via WC, and D/C home via private auto.  Bradly Chris

## 2018-03-18 NOTE — Discharge Summary (Signed)
[facility] [place] [facility]   PATIENT NAME: David Sheppard    MR#:  [id number]  DATE OF BIRTH:  [birth year]  DATE OF ADMISSION:  03/15/2018 ADMITTING PHYSICIAN: Alford Highland, MD  DATE OF DISCHARGE: 03/18/2018 12:28 PM  PRIMARY CARE PHYSICIAN: Hannah Beat, MD    ADMISSION DIAGNOSIS:  Pain in the abdomen [R10.9] Chest pain, unspecified type [R07.9]  DISCHARGE DIAGNOSIS:  Active Problems:   Epigastric pain   Increased liver enzymes   Gallstone pancreatitis   Pain in the abdomen   SECONDARY DIAGNOSIS:   Past Medical History:  Diagnosis Date  . Allergy   . Generalized anxiety disorder 07/30/2011  . GERD (gastroesophageal reflux disease)   . Migraine   . Panic attacks 07/30/2011    HOSPITAL COURSE:  53 year old male with a history of GERD who presented with abdominal pain.  1.  Abdominal pain due to acute cholecystitis: Patient is postoperative day #1 lap chole.  He is doing well.  As per surgery patient is able to be discharged home and will have follow-up with Dr. Lady Gary in 2 weeks. GI consultation appreciated.  2.  Anxiety: Continue Celexa  3.  History of PUD: Continue PPI   DISCHARGE CONDITIONS AND DIET:   Stable for discharge on regular diet  CONSULTS OBTAINED:  Treatment Team:  Midge Minium, MD Duanne Guess, MD  DRUG ALLERGIES:  No Known Allergies  DISCHARGE MEDICATIONS:   Allergies as of 03/18/2018   No Known Allergies     Medication List    TAKE these medications   citalopram 20 MG tablet Commonly known as:  CELEXA TAKE 1 TABLET BY MOUTH EVERY DAY   pantoprazole 40 MG tablet Commonly known as:  PROTONIX TAKE 1 TABLET BY MOUTH EVERY DAY   SUMAtriptan 100 MG tablet Commonly known as:  IMITREX TAKE 1 TABLET BY MOUTH AS NEEDED FOR MIGRAINE. MAY REPEAT IN 2 HRS IF NEEDED. MAX 2TABS/DAY   traMADol 50 MG tablet Commonly known as:  ULTRAM Take 1 tablet (50 mg total) by mouth every 6 (six) hours as needed for  moderate pain (MILD -MOD PAIN, HEADACHE).         Today   CHIEF COMPLAINT:   No abdominal pain.  No nausea or vomiting.  Patient is ready for discharge home   VITAL SIGNS:  Blood pressure 116/80, pulse 82, temperature 97.6 F (36.4 C), temperature source Oral, resp. rate 18, height 5\' 8"  (1.727 m), weight 81.6 kg, SpO2 95 %.   REVIEW OF SYSTEMS:  Review of Systems  Constitutional: Negative.  Negative for chills, fever and malaise/fatigue.  HENT: Negative.  Negative for ear discharge, ear pain, hearing loss, nosebleeds and sore throat.   Eyes: Negative.  Negative for blurred vision and pain.  Respiratory: Negative.  Negative for cough, hemoptysis, shortness of breath and wheezing.   Cardiovascular: Negative.  Negative for chest pain, palpitations and leg swelling.  Gastrointestinal: Negative.  Negative for abdominal pain, blood in stool, diarrhea, nausea and vomiting.  Genitourinary: Negative.  Negative for dysuria.  Musculoskeletal: Negative.  Negative for back pain.  Skin: Negative.   Neurological: Negative for dizziness, tremors, speech change, focal weakness, seizures and headaches.  Endo/Heme/Allergies: Negative.  Does not bruise/bleed easily.  Psychiatric/Behavioral: Negative.  Negative for depression, hallucinations and suicidal ideas.     PHYSICAL EXAMINATION:  GENERAL:  53 y.o.-year-old patient lying in the bed with no acute distress.  NECK:  Supple, no jugular venous distention. No thyroid enlargement, no tenderness.  LUNGS:  Normal breath sounds bilaterally, no wheezing, rales,rhonchi  No use of accessory muscles of respiration.  CARDIOVASCULAR: S1, S2 normal. No murmurs, rubs, or gallops.  ABDOMEN: Soft, non-tender, non-distended. Bowel sounds present. No organomegaly or mass.  EXTREMITIES: No pedal edema, cyanosis, or clubbing.  PSYCHIATRIC: The patient is alert and oriented x 3.  SKIN: No obvious rash, lesion, or ulcer.   DATA REVIEW:   CBC Recent Labs   Lab 03/18/18 0524  WBC 9.5  HGB 13.4  HCT 40.1  PLT 155    Chemistries  Recent Labs  Lab 03/18/18 0524  NA 137  K 4.1  CL 107  CO2 25  GLUCOSE 100*  BUN 10  CREATININE 0.76  CALCIUM 8.1*  AST 84*  ALT 191*  ALKPHOS 161*  BILITOT 1.4*    Cardiac Enzymes Recent Labs  Lab 03/15/18 1044 03/15/18 1603 03/15/18 1829  TROPONINI <0.03 <0.03 <0.03    Microbiology Results  @[email or web address] @  RADIOLOGY:  Nm Hepatobiliary Liver Func  Result Date: 03/16/2018 CLINICAL DATA:  Nausea with pain for 1-2 weeks. EXAM: NUCLEAR MEDICINE HEPATOBILIARY IMAGING TECHNIQUE: Sequential images of the abdomen were obtained out to 60 minutes following intravenous administration of radiopharmaceutical. RADIOPHARMACEUTICALS:  7.4 mCi Tc-66m  Choletec IV COMPARISON:  None FINDINGS: There is prompt uptake of the radiopharmaceutical by the liver. In the first hour there is very mild radiotracer accumulation within the gallbladder. In the second hour radiotracer accumulation within the gallbladder becomes more apparent. No biliary to bowel transit identified within the first or second hour. Delayed imaging was obtained at 2 hours and 30 minutes which shows radiotracer activity in the small bowel. Persistent intense radiotracer activity noted throughout the liver up to 2 hours and 60 minutes. IMPRESSION: 1. Patent cystic duct without evidence for acute cholecystitis. 2. Delayed and diminished biliary to bowel transit rays is noted. This raises suspicion of partial obstruction of the common bile duct. Alternatively, findings may be due to severe hepatocellular dysfunction. Electronically Signed   By: Signa Kell M.D.   On: 03/16/2018 14:40   Mr 3d Recon At Scanner  Result Date: 03/16/2018 CLINICAL DATA:  Epigastric abdominal pain for 1.5 weeks. Abnormal liver function tests with rising elevated total bilirubin. EXAM: MRI ABDOMEN WITHOUT AND WITH CONTRAST (INCLUDING MRCP) TECHNIQUE: Multiplanar multisequence MR  imaging of the abdomen was performed both before and after the administration of intravenous contrast. Heavily T2-weighted images of the biliary and pancreatic ducts were obtained, and three-dimensional MRCP images were rendered by post processing. CONTRAST:  7.5 cc Gadavist IV. COMPARISON:  03/16/2018 hepatobiliary scintigraphy study. FINDINGS: Lower chest: Mild hypoventilatory changes at the dependent lung bases. Hepatobiliary: Normal liver size and configuration. No hepatic steatosis. No liver mass. Subcentimeter layering gallstone in the nondistended gallbladder. Mild diffuse gallbladder wall thickening. No pericholecystic fluid. No biliary ductal dilatation. Common bile duct diameter 4 mm. No filling defects within the biliary tree. No biliary strictures or masses. Pancreas: There is mild pancreatic and peripancreatic edema within the pancreatic neck and proximal body, suggesting mild acute pancreatitis. No peripancreatic fluid collections. Preserved pancreatic parenchymal enhancement. No pancreatic mass or duct dilation. No pancreas divisum. Spleen: Normal size. No mass. Adrenals/Urinary Tract: Normal adrenals. No hydronephrosis. Normal kidneys with no renal mass. Stomach/Bowel: Normal non-distended stomach. Visualized small and large bowel is normal caliber, with no bowel wall thickening. Vascular/Lymphatic: Normal caliber abdominal aorta. Patent portal, splenic, hepatic and renal veins. No pathologically enlarged lymph nodes in the abdomen. Other: No abdominal ascites or focal fluid collection.  Musculoskeletal: No aggressive appearing focal osseous lesions. IMPRESSION: 1. Mild pancreatic and peripancreatic edema within the pancreatic neck and proximal body, suggesting mild acute uncomplicated pancreatitis. Suggest correlation with serum lipase. 2. Cholelithiasis. Mild diffuse gallbladder wall thickening. No gallbladder distention or pericholecystic fluid. 3. No biliary ductal dilatation. CBD diameter 4 mm.  No evidence of choledocholithiasis. Electronically Signed   By: Delbert PhenixJason A Poff M.D.   On: 03/16/2018 21:31   Mr Abdomen Mrcp Vivien RossettiW Wo Contast  Result Date: 03/16/2018 CLINICAL DATA:  Epigastric abdominal pain for 1.5 weeks. Abnormal liver function tests with rising elevated total bilirubin. EXAM: MRI ABDOMEN WITHOUT AND WITH CONTRAST (INCLUDING MRCP) TECHNIQUE: Multiplanar multisequence MR imaging of the abdomen was performed both before and after the administration of intravenous contrast. Heavily T2-weighted images of the biliary and pancreatic ducts were obtained, and three-dimensional MRCP images were rendered by post processing. CONTRAST:  7.5 cc Gadavist IV. COMPARISON:  03/16/2018 hepatobiliary scintigraphy study. FINDINGS: Lower chest: Mild hypoventilatory changes at the dependent lung bases. Hepatobiliary: Normal liver size and configuration. No hepatic steatosis. No liver mass. Subcentimeter layering gallstone in the nondistended gallbladder. Mild diffuse gallbladder wall thickening. No pericholecystic fluid. No biliary ductal dilatation. Common bile duct diameter 4 mm. No filling defects within the biliary tree. No biliary strictures or masses. Pancreas: There is mild pancreatic and peripancreatic edema within the pancreatic neck and proximal body, suggesting mild acute pancreatitis. No peripancreatic fluid collections. Preserved pancreatic parenchymal enhancement. No pancreatic mass or duct dilation. No pancreas divisum. Spleen: Normal size. No mass. Adrenals/Urinary Tract: Normal adrenals. No hydronephrosis. Normal kidneys with no renal mass. Stomach/Bowel: Normal non-distended stomach. Visualized small and large bowel is normal caliber, with no bowel wall thickening. Vascular/Lymphatic: Normal caliber abdominal aorta. Patent portal, splenic, hepatic and renal veins. No pathologically enlarged lymph nodes in the abdomen. Other: No abdominal ascites or focal fluid collection. Musculoskeletal: No aggressive  appearing focal osseous lesions. IMPRESSION: 1. Mild pancreatic and peripancreatic edema within the pancreatic neck and proximal body, suggesting mild acute uncomplicated pancreatitis. Suggest correlation with serum lipase. 2. Cholelithiasis. Mild diffuse gallbladder wall thickening. No gallbladder distention or pericholecystic fluid. 3. No biliary ductal dilatation. CBD diameter 4 mm. No evidence of choledocholithiasis. Electronically Signed   By: Delbert PhenixJason A Poff M.D.   On: 03/16/2018 21:31      Allergies as of 03/18/2018   No Known Allergies     Medication List    TAKE these medications   citalopram 20 MG tablet Commonly known as:  CELEXA TAKE 1 TABLET BY MOUTH EVERY DAY   pantoprazole 40 MG tablet Commonly known as:  PROTONIX TAKE 1 TABLET BY MOUTH EVERY DAY   SUMAtriptan 100 MG tablet Commonly known as:  IMITREX TAKE 1 TABLET BY MOUTH AS NEEDED FOR MIGRAINE. MAY REPEAT IN 2 HRS IF NEEDED. MAX 2TABS/DAY   traMADol 50 MG tablet Commonly known as:  ULTRAM Take 1 tablet (50 mg total) by mouth every 6 (six) hours as needed for moderate pain (MILD -MOD PAIN, HEADACHE).         Management plans discussed with the patient and he is in agreement. Stable for discharge home  Patient should follow up with surgery  CODE STATUS:     Code Status Orders  (From admission, onward)         Start     Ordered   03/15/18 1547  Full code  Continuous     03/15/18 1547        Code Status History  This patient has a current code status but no historical code status.      TOTAL TIME TAKING CARE OF THIS PATIENT: 38 minutes.    Note: This dictation was prepared with Dragon dictation along with smaller phrase technology. Any transcriptional errors that result from this process are unintentional.  Alec Jaros M.D on 03/18/2018 at 12:35 PM  Between 7am to 6pm - Pager - [phone] After 6pm go to [email or web address] - Social research officer, governmentpassword [email or web address]  [facility]  Office   [phone]  CC: Primary care physician; Hannah Beatopland, [place], MD

## 2018-03-20 ENCOUNTER — Telehealth: Payer: Self-pay

## 2018-03-20 ENCOUNTER — Telehealth: Payer: Self-pay | Admitting: *Deleted

## 2018-03-20 ENCOUNTER — Encounter: Payer: Self-pay | Admitting: General Surgery

## 2018-03-20 NOTE — Telephone Encounter (Signed)
Patient called and had his gallbladder removed by Dr.Cannon on Friday 03/17/18 and wants to know when he can go back to work and he needs a note to give to his job

## 2018-03-20 NOTE — Anesthesia Postprocedure Evaluation (Signed)
Anesthesia Post Note  Patient: David Sheppard  Procedure(s) Performed: LAPAROSCOPIC CHOLECYSTECTOMY (N/A )  Patient location during evaluation: PACU Anesthesia Type: General Level of consciousness: awake and alert and oriented Pain management: pain level controlled Vital Signs Assessment: post-procedure vital signs reviewed and stable Respiratory status: spontaneous breathing, nonlabored ventilation and respiratory function stable Cardiovascular status: blood pressure returned to baseline and stable Postop Assessment: no signs of nausea or vomiting Anesthetic complications: no     Last Vitals:  Vitals:   03/17/18 2114 03/18/18 0518  BP: 100/65 116/80  Pulse: 82 82  Resp:  18  Temp:  36.4 C  SpO2:  95%    Last Pain:  Vitals:   03/18/18 0618  TempSrc:   PainSc: Asleep                 Perl Kerney

## 2018-03-20 NOTE — Telephone Encounter (Signed)
Patient ended up at the hospital and was discharged on 03/18/2018. Per D/C notes to follow up with Dr. Patsy Lager in 1 week and general surgeon in 2 weeks. He had laparoscopic cholecystectomy done. Where can patient be worked in next week if he does need to be seen. Not TCM eligible due to [employer].

## 2018-03-20 NOTE — Telephone Encounter (Signed)
Spoke with patient and he does a lot of activity at work moving around a lot and using [employer]. Instructed may want to be out until next week but could be out for 2 weeks if needed. He will speak with his employer and will call us back with his requirements as to what he needs and when he wants to return to work.

## 2018-03-20 NOTE — Telephone Encounter (Signed)
I can see him on next Monday at 10:20 AM

## 2018-03-21 ENCOUNTER — Telehealth: Payer: Self-pay | Admitting: General Surgery

## 2018-03-21 NOTE — Telephone Encounter (Signed)
Appointment 1/13 pt aware °

## 2018-03-21 NOTE — Telephone Encounter (Signed)
Patient has called and stated that he needs a letter for [employer]. Patient is scheduled to be in [employer] in [place] [employer] on 03/27/18. He has spoke with the Doctor, hospital and he was advised from her that he would need a note from our office that states that he is recovering from surgery. The date of surgery and type of surgery needs to be in the letter. Please contact patient at [phone].  Patient would like to pick this letter up when ready.

## 2018-03-21 NOTE — Telephone Encounter (Signed)
Patient is calling again asking if we could have a letter typed up. Please call patient and advise.

## 2018-03-21 NOTE — Telephone Encounter (Signed)
This would need to go to the nurses!

## 2018-03-21 NOTE — Telephone Encounter (Signed)
Patient stated he needs a note for jury duty just stating when his surgery was done and that he is still under a doctors care.

## 2018-03-21 NOTE — Telephone Encounter (Signed)
Patient states that he will get this paperwork when he is seen for his post op.

## 2018-03-22 LAB — SURGICAL PATHOLOGY

## 2018-03-25 NOTE — Progress Notes (Signed)
Dr. Karleen HampshireSpencer T. Manjinder Breau, David Sheppard, CAQ Sports Medicine Primary Care and Sports Medicine [address] [place], [address] Phone: [phone] Fax: [phone]  03/27/2018  Patient: David Sheppard, MRN: [id number], DOB: 11-Dec-1965, 53 y.o.  Primary Physician:  David Sheppard, David Schauer, David Sheppard   Chief Complaint  Patient presents with  . Hospitalization Follow-up   Subjective:   David SloughJeffrey V Sheppard is a 53 y.o. very pleasant male patient who presents with the following:  TCM visit, hospital f/u  DATE OF ADMISSION:  03/15/2018  DATE OF DISCHARGE: 03/18/2018   Admitted with abdominal pain secondary to acute cholecystitis and had a lap chole by general surgery.  Lipase was also elevated  LFT's were quite high and bilirubin peaked at 6.2 At the time of discharge: Bili 1.4 AST 84 ALT 191  Albumin was 3.3 at discharge.  He is really doing a lot better today.  He is eating and drinking normally, he is having normal urination, normal bowel movements, and he really has no abdominal pain.  He has some minor incisional pain only.  Past Medical History, Surgical History, Social History, Family History, Problem List, Medications, and Allergies have been reviewed and updated if relevant.  Patient Active Problem List   Diagnosis Date Noted  . Gallstone pancreatitis   . Increased liver enzymes   . Epigastric pain 03/15/2018  . Generalized anxiety disorder 07/30/2011  . Panic attacks 07/30/2011  . Left low back pain 07/19/2008  . OTHER MALAISE AND FATIGUE 06/24/2008  . GERD 06/21/2008  . MIGRAINE HEADACHE 06/04/2008  . ALLERGIC RHINITIS 06/04/2008    Past Medical History:  Diagnosis Date  . Allergy   . Generalized anxiety disorder 07/30/2011  . GERD (gastroesophageal reflux disease)   . Migraine   . Panic attacks 07/30/2011    Past Surgical History:  Procedure Laterality Date  . CHOLECYSTECTOMY N/A 03/17/2018   Procedure: LAPAROSCOPIC CHOLECYSTECTOMY;  Surgeon: Duanne Guessannon, Jennifer, David Sheppard;   Location: [facility] ORS;  Service: General;  Laterality: N/A;  . HERNIA REPAIR    . TONSILLECTOMY AND ADENOIDECTOMY      Social History   Socioeconomic History  . Marital status: Married    Spouse name: Not on file  . Number of children: 2  . Years of education: Not on file  . Highest education level: Not on file  Occupational History  . Occupation: injection mold setup machinery   Social Needs  . Financial resource strain: Not on file  . Food insecurity:    Worry: Not on file    Inability: Not on file  . Transportation needs:    Medical: Not on file    Non-medical: Not on file  Tobacco Use  . Smoking status: Current Every Day Smoker    Packs/day: 0.50    Types: Cigarettes  . Smokeless tobacco: Former Engineer, waterUser  Substance and Sexual Activity  . Alcohol use: Yes    Alcohol/week: 0.0 standard drinks    Comment: occ  . Drug use: No  . Sexual activity: Not on file  Lifestyle  . Physical activity:    Days per week: Not on file    Minutes per session: Not on file  . Stress: Not on file  Relationships  . Social connections:    Talks on phone: Not on file    Gets together: Not on file    Attends religious service: Not on file    Active member of club or organization: Not on file    Attends meetings of clubs or  organizations: Not on file    Relationship status: Not on file  . Intimate partner violence:    Fear of current or ex partner: Not on file    Emotionally abused: Not on file    Physically abused: Not on file    Forced sexual activity: Not on file  Other Topics Concern  . Not on file  Social History Narrative   1 daily caffiene use   Regular exercise-walking          Family History  Problem Relation Age of Onset  . Hypertension Father   . Heart disease Paternal Grandfather   . Hyperlipidemia Paternal Grandfather   . Diabetes Paternal Grandfather   . Hyperlipidemia Paternal Grandmother   . Diabetes Paternal Grandmother     No Known Allergies  Medication list  reviewed and updated in full in [facility].   GEN: No acute illnesses, no fevers, chills. GI: No n/v/d, eating normally Pulm: No SOB Interactive and getting along well at home.  Otherwise, ROS is as per the HPI.  Objective:   BP 100/70   Pulse 77   Temp 98.4 F (36.9 C) (Oral)   Ht 5' 7.25" (1.708 m)   Wt 179 lb 12 oz (81.5 kg)   SpO2 98%   BMI 27.94 kg/m   GEN: WDWN, NAD, Non-toxic, A & O x 3 HEENT: Atraumatic, Normocephalic. Neck supple. No masses, No LAD. Ears and Nose: No external deformity. CV: RRR, No M/G/R. No JVD. No thrill. No extra heart sounds. PULM: CTA B, no wheezes, crackles, rhonchi. No retractions. No resp. distress. No accessory muscle use. ABD: S, NT, ND, + BS, No rebound, No HSM  EXTR: No c/c/e NEURO Normal gait.  PSYCH: Normally interactive. Conversant. Not depressed or anxious appearing.  Calm demeanor.   Laboratory and Imaging Data:  Assessment and Plan:   Acute cholecystitis - Plan: CBC with Differential/Platelet, Basic metabolic panel, Hepatic function panel, Lipase  Gallstone pancreatitis - Plan: CBC with Differential/Platelet, Basic metabolic panel, Hepatic function panel, Lipase  Increased liver enzymes - Plan: CBC with Differential/Platelet, Basic metabolic panel, Hepatic function panel, Lipase  Epigastric pain  Recheck abnormal labs from the hospital including his liver enzymes, which should be returning to normal at this point, as well his his pancreatic enzymes.  Otherwise he is doing well and has follow-up with general surgery later this week.  Follow-up: No follow-ups on file.  Orders Placed This Encounter  Procedures  . CBC with Differential/Platelet  . Basic metabolic panel  . Hepatic function panel  . Lipase    Signed,  David Houseworth T. Zayne Draheim, David Sheppard   Outpatient Encounter Medications as of 03/27/2018  Medication Sig  . citalopram (CELEXA) 20 MG tablet TAKE 1 TABLET BY MOUTH EVERY DAY  . pantoprazole (PROTONIX) 40 MG  tablet TAKE 1 TABLET BY MOUTH EVERY DAY  . SUMAtriptan (IMITREX) 100 MG tablet TAKE 1 TABLET BY MOUTH AS NEEDED FOR MIGRAINE. MAY REPEAT IN 2 HRS IF NEEDED. MAX 2TABS/DAY  . traMADol (ULTRAM) 50 MG tablet Take 1 tablet (50 mg total) by mouth every 6 (six) hours as needed for moderate pain (MILD -MOD PAIN, HEADACHE).   No facility-administered encounter medications on file as of 03/27/2018.

## 2018-03-27 ENCOUNTER — Encounter: Payer: Self-pay | Admitting: Family Medicine

## 2018-03-27 ENCOUNTER — Ambulatory Visit: Payer: BLUE CROSS/BLUE SHIELD | Admitting: Family Medicine

## 2018-03-27 VITALS — BP 100/70 | HR 77 | Temp 98.4°F | Ht 67.25 in | Wt 179.8 lb

## 2018-03-27 DIAGNOSIS — R1013 Epigastric pain: Secondary | ICD-10-CM | POA: Diagnosis not present

## 2018-03-27 DIAGNOSIS — K810 Acute cholecystitis: Secondary | ICD-10-CM

## 2018-03-27 DIAGNOSIS — R748 Abnormal levels of other serum enzymes: Secondary | ICD-10-CM

## 2018-03-27 DIAGNOSIS — K851 Biliary acute pancreatitis without necrosis or infection: Secondary | ICD-10-CM

## 2018-03-27 LAB — HEPATIC FUNCTION PANEL
ALT: 57 U/L — ABNORMAL HIGH (ref 0–53)
AST: 29 U/L (ref 0–37)
Albumin: 3.9 g/dL (ref 3.5–5.2)
Alkaline Phosphatase: 162 U/L — ABNORMAL HIGH (ref 39–117)
Bilirubin, Direct: 0.3 mg/dL (ref 0.0–0.3)
Total Bilirubin: 0.5 mg/dL (ref 0.2–1.2)
Total Protein: 7.2 g/dL (ref 6.0–8.3)

## 2018-03-27 LAB — BASIC METABOLIC PANEL
BUN: 9 mg/dL (ref 6–23)
CO2: 27 mEq/L (ref 19–32)
Calcium: 9.3 mg/dL (ref 8.4–10.5)
Chloride: 104 mEq/L (ref 96–112)
Creatinine, Ser: 0.92 mg/dL (ref 0.40–1.50)
GFR: 92.56 mL/min (ref >60.00)
Glucose, Bld: 93 mg/dL (ref 70–99)
Potassium: 4.4 mEq/L (ref 3.5–5.1)
Sodium: 138 mEq/L (ref 135–145)

## 2018-03-27 LAB — CBC WITH DIFFERENTIAL/PLATELET
Basophils Absolute: 0.1 10*3/uL (ref 0.0–0.1)
Basophils Relative: 1 % (ref 0.0–3.0)
Eosinophils Absolute: 0.4 10*3/uL (ref 0.0–0.7)
Eosinophils Relative: 4.9 % (ref 0.0–5.0)
HCT: 41.5 % (ref 39.0–52.0)
Hemoglobin: 14.5 g/dL (ref 13.0–17.0)
Lymphocytes Relative: 22.1 % (ref 12.0–46.0)
Lymphs Abs: 1.6 10*3/uL (ref 0.7–4.0)
MCHC: 34.9 g/dL (ref 30.0–36.0)
MCV: 93.5 fl (ref 78.0–100.0)
Monocytes Absolute: 0.5 10*3/uL (ref 0.1–1.0)
Monocytes Relative: 6.3 % (ref 3.0–12.0)
Neutro Abs: 4.8 10*3/uL (ref 1.4–7.7)
Neutrophils Relative %: 65.7 % (ref 43.0–77.0)
Platelets: 306 10*3/uL (ref 150.0–400.0)
RBC: 4.44 Mil/uL (ref 4.22–5.81)
RDW: 12.4 % (ref 11.5–15.5)
WBC: 7.2 10*3/uL (ref 4.0–10.5)

## 2018-03-27 LAB — LIPASE: Lipase: 104 U/L — ABNORMAL HIGH (ref 11.0–59.0)

## 2018-03-28 ENCOUNTER — Telehealth: Payer: Self-pay | Admitting: *Deleted

## 2018-03-28 NOTE — Telephone Encounter (Signed)
I talked with the patient and he is unsure of the return to work date, his appointment is tomorrow and wants to complete the FMLA papers then.

## 2018-03-29 ENCOUNTER — Ambulatory Visit (INDEPENDENT_AMBULATORY_CARE_PROVIDER_SITE_OTHER): Payer: BLUE CROSS/BLUE SHIELD | Admitting: General Surgery

## 2018-03-29 ENCOUNTER — Other Ambulatory Visit: Payer: Self-pay

## 2018-03-29 ENCOUNTER — Encounter: Payer: Self-pay | Admitting: General Surgery

## 2018-03-29 VITALS — BP 115/77 | HR 78 | Temp 97.9°F | Resp 16 | Ht 67.0 in | Wt 177.0 lb

## 2018-03-29 DIAGNOSIS — Z9049 Acquired absence of other specified parts of digestive tract: Secondary | ICD-10-CM

## 2018-03-29 NOTE — Patient Instructions (Addendum)
The patient is aware to call back for any questions or concerns. Resume activities as tolerated. No lifting over 10 pounds for 4 more weeks ( 04-28-18) GENERAL POST-OPERATIVE PATIENT INSTRUCTIONS   WOUND CARE INSTRUCTIONS:  Keep a dry clean dressing on the wound if there is drainage. The initial bandage may be removed after 24 hours.  Once the wound has quit draining you may leave it open to air.  If clothing rubs against the wound or causes irritation and the wound is not draining you may cover it with a dry dressing during the daytime.  Try to keep the wound dry and avoid ointments on the wound unless directed to do so.  If the wound becomes bright red and painful or starts to drain infected material that is not clear, please contact your physician immediately.  If the wound is mildly pink and has a thick firm ridge underneath it, this is normal, and is referred to as a healing ridge.  This will resolve over the next 4-6 weeks.  BATHING: You may shower if you have been informed of this by your surgeon. However, Please do not submerge in a tub, hot tub, or pool until incisions are completely sealed or have been told by your surgeon that you may do so.  DIET:  You may eat any foods that you can tolerate.  It is a good idea to eat a high fiber diet and take in plenty of fluids to prevent constipation.  If you do become constipated you may want to take a mild laxative or take ducolax tablets on a daily basis until your bowel habits are regular.  Constipation can be very uncomfortable, along with straining, after recent surgery.  ACTIVITY:  You are encouraged to cough and deep breath or use your incentive spirometer if you were given one, every 15-30 minutes when awake.  This will help prevent respiratory complications and low grade fevers post-operatively if you had a general anesthetic.  You may want to hug a pillow when coughing and sneezing to add additional support to the surgical area, if you had  abdominal or chest surgery, which will decrease pain during these times.  At that time- Listen to your body when lifting, if you have pain when lifting, stop and then try again in a few days. Soreness after doing exercises or activities of daily living is normal as you get back in to your normal routine.  MEDICATIONS:  Try to take narcotic medications and anti-inflammatory medications, such as tylenol, ibuprofen, naprosyn, etc., with food.  This will minimize stomach upset from the medication.  Should you develop nausea and vomiting from the pain medication, or develop a rash, please discontinue the medication and contact your physician.  You should not drive, make important decisions, or operate machinery when taking narcotic pain medication.  SUNBLOCK Use sun block to incision area over the next year if this area will be exposed to sun. This helps decrease scarring and will allow you avoid a permanent darkened area over your incision.  QUESTIONS:  Please feel free to call our office if you have any questions, and we will be glad to assist you.

## 2018-03-29 NOTE — Progress Notes (Signed)
David Sheppard is here today for postoperative visit after undergoing an uncomplicated laparoscopic cholecystectomy on 17 March 2018.  He states that he has done well since his operation.  He denies fevers or chills, no nausea or vomiting.  His appetite is normal and he has not required pain medication for some time.  Final pathology demonstrated:  DIAGNOSIS:  A. GALLBLADDER; CHOLECYSTECTOMY:  - CHRONIC AND SUBACUTE CHOLECYSTITIS.  - NEGATIVE FOR HIGH-GRADE DYSPLASIA AND MALIGNANCY.  Vitals:   03/29/18 1534  BP: 115/77  Pulse: 78  Resp: 16  Temp: 97.9 F (36.6 C)  SpO2: 98%   Focused examination: All of the laparoscopic port sites appear to be healing well.  There is been a small amount of skin separation at the middle right upper quadrant incision.  There is no erythema induration or drainage.  No concern for infection.  Assessment and plan: This is a 53 year old man who underwent an uncomplicated laparoscopic cholecystectomy at the beginning of the month.  He is doing well.  He continues to have a 10 pound lifting restriction until 14 February, however he is welcome to return back to work with those restrictions.  At this time he has no ongoing surgical needs and I will see him on an as-needed basis.

## 2018-03-30 ENCOUNTER — Encounter: Payer: Self-pay | Admitting: *Deleted

## 2018-03-30 ENCOUNTER — Telehealth: Payer: Self-pay | Admitting: *Deleted

## 2018-03-30 NOTE — Progress Notes (Signed)
Letter   Done

## 2018-03-30 NOTE — Telephone Encounter (Signed)
Patient called answering service and the message that was left was "work sent him home/needs to stay on short term. Please call and advise

## 2018-04-25 ENCOUNTER — Other Ambulatory Visit: Payer: Self-pay | Admitting: Family Medicine

## 2018-05-25 ENCOUNTER — Encounter: Payer: Self-pay | Admitting: Family Medicine

## 2018-05-25 ENCOUNTER — Other Ambulatory Visit: Payer: Self-pay

## 2018-05-25 ENCOUNTER — Ambulatory Visit (INDEPENDENT_AMBULATORY_CARE_PROVIDER_SITE_OTHER): Payer: BLUE CROSS/BLUE SHIELD | Admitting: Family Medicine

## 2018-05-25 DIAGNOSIS — N509 Disorder of male genital organs, unspecified: Secondary | ICD-10-CM

## 2018-05-25 DIAGNOSIS — N50819 Testicular pain, unspecified: Secondary | ICD-10-CM

## 2018-05-25 NOTE — Patient Instructions (Signed)
I don't see anything ominous on exam.   If you keep having any symptoms, then let us know and we'll set up with the ultrasound.  Take care.  Glad to see you.

## 2018-05-25 NOTE — Progress Notes (Signed)
Possible R testicle mass noted about 2 weeks ago.  Can be tender to palpation.  No L sided sx.  No FCNAVD.  Feels well o/w.  No dysuria.  No rash, ulcers, etc on the skin.  No discharge.  No trauma.    Meds, vitals, and allergies reviewed.   ROS: Per HPI unless specifically indicated in ROS section   nad ncat rrr ctab Abd soft, not ttp.  Testes bilaterally descended without nodularity, tenderness or masses. No scrotal masses or lesions. No penis lesions or urethral discharge. Normal inspection and transillumination of the testicles, no clear mass noted.

## 2018-05-28 DIAGNOSIS — N50819 Testicular pain, unspecified: Secondary | ICD-10-CM | POA: Insufficient documentation

## 2018-05-28 DIAGNOSIS — N509 Disorder of male genital organs, unspecified: Principal | ICD-10-CM

## 2018-05-28 NOTE — Assessment & Plan Note (Signed)
Episodically noted. D/w pt.   Declined u/s.   Rationale for ultrasound discussed with patient. Offered and encouraged ultrasound though I do not see any ominous findings today.  He wanted to observe for now and update me as needed.  If he calls back with other symptoms we can always arrange for ultrasound.  Routed to PCP as FYI.

## 2018-07-18 ENCOUNTER — Other Ambulatory Visit: Payer: Self-pay | Admitting: Family Medicine

## 2018-08-21 ENCOUNTER — Other Ambulatory Visit: Payer: Self-pay | Admitting: Family Medicine

## 2018-10-03 ENCOUNTER — Other Ambulatory Visit: Payer: Self-pay | Admitting: Family Medicine

## 2018-10-03 NOTE — Telephone Encounter (Signed)
Electronic refill request Imitrex Last office visit 05/25/18 acute Last refill 03/17/18 #9/5

## 2018-10-17 ENCOUNTER — Other Ambulatory Visit: Payer: Self-pay | Admitting: Family Medicine

## 2018-10-17 DIAGNOSIS — Z0000 Encounter for general adult medical examination without abnormal findings: Secondary | ICD-10-CM

## 2018-10-18 ENCOUNTER — Telehealth: Payer: Self-pay

## 2018-10-18 NOTE — Telephone Encounter (Signed)
Left detailed VM w COVID screen and back door lab info and front door DR appt info

## 2018-10-20 ENCOUNTER — Other Ambulatory Visit (INDEPENDENT_AMBULATORY_CARE_PROVIDER_SITE_OTHER): Payer: BC Managed Care – PPO

## 2018-10-20 ENCOUNTER — Other Ambulatory Visit: Payer: Self-pay

## 2018-10-20 DIAGNOSIS — E785 Hyperlipidemia, unspecified: Secondary | ICD-10-CM | POA: Diagnosis not present

## 2018-10-20 DIAGNOSIS — Z0000 Encounter for general adult medical examination without abnormal findings: Secondary | ICD-10-CM | POA: Diagnosis not present

## 2018-10-20 NOTE — Addendum Note (Signed)
Addended by: Ellamae Sia on: 10/20/2018 01:58 PM   Modules accepted: Orders

## 2018-10-22 ENCOUNTER — Other Ambulatory Visit: Payer: Self-pay | Admitting: Family Medicine

## 2018-10-23 LAB — CBC WITH DIFFERENTIAL/PLATELET
Absolute Monocytes: 490 cells/uL (ref 200–950)
Basophils Absolute: 41 cells/uL (ref 0–200)
Basophils Relative: 0.7 %
Eosinophils Absolute: 271 cells/uL (ref 15–500)
Eosinophils Relative: 4.6 %
HCT: 44.5 % (ref 38.5–50.0)
Hemoglobin: 15.8 g/dL (ref 13.2–17.1)
Lymphs Abs: 1923 cells/uL (ref 850–3900)
MCH: 32.6 pg (ref 27.0–33.0)
MCHC: 35.5 g/dL (ref 32.0–36.0)
MCV: 91.8 fL (ref 80.0–100.0)
MPV: 10.6 fL (ref 7.5–12.5)
Monocytes Relative: 8.3 %
Neutro Abs: 3174 cells/uL (ref 1500–7800)
Neutrophils Relative %: 53.8 %
Platelets: 205 10*3/uL (ref 140–400)
RBC: 4.85 10*6/uL (ref 4.20–5.80)
RDW: 11.8 % (ref 11.0–15.0)
Total Lymphocyte: 32.6 %
WBC: 5.9 10*3/uL (ref 3.8–10.8)

## 2018-10-23 LAB — HEPATIC FUNCTION PANEL
AG Ratio: 1.6 (calc) (ref 1.0–2.5)
ALT: 52 U/L — ABNORMAL HIGH (ref 9–46)
AST: 28 U/L (ref 10–35)
Albumin: 4.3 g/dL (ref 3.6–5.1)
Alkaline phosphatase (APISO): 106 U/L (ref 35–144)
Bilirubin, Direct: 0.1 mg/dL (ref 0.0–0.2)
Globulin: 2.7 g/dL (calc) (ref 1.9–3.7)
Indirect Bilirubin: 0.4 mg/dL (calc) (ref 0.2–1.2)
Total Bilirubin: 0.5 mg/dL (ref 0.2–1.2)
Total Protein: 7 g/dL (ref 6.1–8.1)

## 2018-10-23 LAB — BASIC METABOLIC PANEL
BUN: 14 mg/dL (ref 7–25)
CO2: 28 mmol/L (ref 20–32)
Calcium: 10 mg/dL (ref 8.6–10.3)
Chloride: 103 mmol/L (ref 98–110)
Creat: 0.98 mg/dL (ref 0.70–1.33)
Glucose, Bld: 86 mg/dL (ref 65–99)
Potassium: 4.6 mmol/L (ref 3.5–5.3)
Sodium: 141 mmol/L (ref 135–146)

## 2018-10-23 LAB — LIPID PANEL
Cholesterol: 224 mg/dL — ABNORMAL HIGH (ref <200)
HDL: 43 mg/dL (ref >=40)
LDL Cholesterol (Calc): 159 mg/dL (calc) — ABNORMAL HIGH
Non-HDL Cholesterol (Calc): 181 mg/dL (calc) — ABNORMAL HIGH (ref <130)
Total CHOL/HDL Ratio: 5.2 (calc) — ABNORMAL HIGH (ref <5.0)
Triglycerides: 106 mg/dL (ref <150)

## 2018-10-23 LAB — HEMOGLOBIN A1C
Hgb A1c MFr Bld: 5 % of total Hgb (ref <5.7)
Mean Plasma Glucose: 97 (calc)
eAG (mmol/L): 5.4 (calc)

## 2018-10-23 LAB — PSA, TOTAL WITH REFLEX TO PSA, FREE: PSA, Total: 0.3 ng/mL (ref <=4.0)

## 2018-10-24 NOTE — Progress Notes (Signed)
David Swiss T. Marvon Shillingburg, MD Primary Care and Sports Medicine [facility] [address] [place], [address] Phone: [phone]  FAX: [phone]  David Sheppard - 53 y.o. male  MRN [id number]  Date of Birth: 04-17-1965  Visit Date: 10/25/2018  PCP: David Beat, MD  Referred by: David Beat, MD  Chief Complaint  Patient presents with  . Annual Exam   Patient Care Team: David Beat, MD as PCP - General (Family Medicine) Subjective:   David Sheppard is a 53 y.o. pleasant patient who presents with the following:  Preventative Health Maintenance Visit:  Health Maintenance Summary Reviewed and updated, unless pt declines services.  Tobacco History Reviewed. 1 ppd Alcohol: No concerns, no excessive use Exercise Habits: Some activity, rec at least 30 mins 5 times a week STD concerns: no risk or activity to increase risk Drug Use: None Encouraged self-testicular check  Pretty healthy guy who is here for an annual physical.  Health Maintenance  Topic Date Due  . COLONOSCOPY  09/04/2017  . INFLUENZA VACCINE  10/14/2018  . TETANUS/TDAP  10/24/2028  . HIV Screening  Completed   Immunization History  Administered Date(s) Administered  . Td 06/03/2008  . Tdap 10/25/2018   Patient Active Problem List   Diagnosis Date Noted  . Testicle tenderness 05/28/2018  . Gallstone pancreatitis   . Increased liver enzymes   . Generalized anxiety disorder 07/30/2011  . Panic attacks 07/30/2011  . GERD 06/21/2008  . MIGRAINE HEADACHE 06/04/2008  . ALLERGIC RHINITIS 06/04/2008   Past Medical History:  Diagnosis Date  . Allergy   . Generalized anxiety disorder 07/30/2011  . GERD (gastroesophageal reflux disease)   . Migraine   . Panic attacks 07/30/2011   Past Surgical History:  Procedure Laterality Date  . CHOLECYSTECTOMY N/A 03/17/2018   Procedure: LAPAROSCOPIC CHOLECYSTECTOMY;  Surgeon: Duanne Guess, MD;  Location:  [facility] ORS;  Service: General;  Laterality: N/A;  . HERNIA REPAIR    . TONSILLECTOMY AND ADENOIDECTOMY     Social History   Socioeconomic History  . Marital status: Married    Spouse name: Not on file  . Number of children: 2  . Years of education: Not on file  . Highest education level: Not on file  Occupational History  . Occupation: injection mold setup machinery   Social Needs  . Financial resource strain: Not on file  . Food insecurity    Worry: Not on file    Inability: Not on file  . Transportation needs    Medical: Not on file    Non-medical: Not on file  Tobacco Use  . Smoking status: Current Every Day Smoker    Packs/day: 0.50    Years: 10.00    Pack years: 5.00    Types: Cigarettes  . Smokeless tobacco: Former Engineer, water and Sexual Activity  . Alcohol use: Yes    Alcohol/week: 0.0 standard drinks    Comment: occ  . Drug use: No  . Sexual activity: Not on file  Lifestyle  . Physical activity    Days per week: Not on file    Minutes per session: Not on file  . Stress: Not on file  Relationships  . Social Musician on phone: Not on file    Gets together: Not on file    Attends religious service: Not on file    Active member of club or organization: Not on file    Attends  meetings of clubs or organizations: Not on file    Relationship status: Not on file  . Intimate partner violence    Fear of current or ex partner: Not on file    Emotionally abused: Not on file    Physically abused: Not on file    Forced sexual activity: Not on file  Other Topics Concern  . Not on file  Social History Narrative   1 daily caffiene use   Regular exercise-walking         Family History  Problem Relation Age of Onset  . Hypertension Father   . Heart disease Paternal Grandfather   . Hyperlipidemia Paternal Grandfather   . Diabetes Paternal Grandfather   . Hyperlipidemia Paternal Grandmother   . Diabetes Paternal Grandmother    No Known Allergies   Medication list has been reviewed and updated.   General: Denies fever, chills, sweats. No significant weight loss. Eyes: Denies blurring,significant itching ENT: Denies earache, sore throat, and hoarseness. Cardiovascular: Denies chest pains, palpitations, dyspnea on exertion Respiratory: Denies cough, dyspnea at rest,wheeezing Breast: no concerns about lumps GI: Denies nausea, vomiting, diarrhea, constipation, change in bowel habits, abdominal pain, melena, hematochezia GU: Denies penile discharge, ED, urinary flow / outflow problems. No STD concerns. Musculoskeletal: Denies back pain, joint pain Derm: Denies rash, itching Neuro: Denies  paresthesias, frequent falls, frequent headaches Psych: Denies depression, anxiety Endocrine: Denies cold intolerance, heat intolerance, polydipsia Heme: Denies enlarged lymph nodes Allergy: No hayfever  Objective:   BP 100/66   Pulse 73   Temp 98.6 F (37 C) (Oral)   Ht 5' 7.5" (1.715 m)   Wt 181 lb 8 oz (82.3 kg)   SpO2 95%   BMI 28.01 kg/m  Ideal Body Weight: Weight in (lb) to have BMI = 25: 161.7  Ideal Body Weight: Weight in (lb) to have BMI = 25: 161.7 No exam data present [facility] 10/25/2018 08/04/2017  Decreased Interest 0 0  Down, Depressed, Hopeless 0 0  PHQ - 2 Score 0 0     GEN: well developed, well nourished, no acute distress Eyes: conjunctiva and lids normal, PERRLA, EOMI ENT: TM clear, nares clear, oral exam WNL Neck: supple, no lymphadenopathy, no thyromegaly, no JVD Pulm: clear to auscultation and percussion, respiratory effort normal CV: regular rate and rhythm, S1-S2, no murmur, rub or gallop, no bruits, peripheral pulses normal and symmetric, no cyanosis, clubbing, edema or varicosities GI: soft, non-tender; no hepatosplenomegaly, masses; active bowel sounds all quadrants GU: no hernia, testicular mass, penile discharge Lymph: no cervical, axillary or inguinal adenopathy MSK: gait normal, muscle  tone and strength WNL, no joint swelling, effusions, discoloration, crepitus  SKIN: clear, good turgor, color WNL, no rashes, lesions, or ulcerations Neuro: normal mental status, normal strength, sensation, and motion Psych: alert; oriented to person, place and time, normally interactive and not anxious or depressed in appearance. All labs reviewed with patient. Results for orders placed or performed in visit on 10/20/18  Lipid panel  Result Value Ref Range   Cholesterol 224 (H) <200 mg/dL   HDL 43 > OR = 40 mg/dL   Triglycerides 811106 <914<150 mg/dL   LDL Cholesterol (Calc) 159 (H) mg/dL (calc)   Total CHOL/HDL Ratio 5.2 (H) <5.0 (calc)   Non-HDL Cholesterol (Calc) 181 (H) <130 mg/dL (calc)  Hepatic function panel  Result Value Ref Range   Total Protein 7.0 6.1 - 8.1 g/dL   Albumin 4.3 3.6 - 5.1 g/dL   Globulin 2.7 1.9 -  3.7 g/dL (calc)   AG Ratio 1.6 1.0 - 2.5 (calc)   Total Bilirubin 0.5 0.2 - 1.2 mg/dL   Bilirubin, Direct 0.1 0.0 - 0.2 mg/dL   Indirect Bilirubin 0.4 0.2 - 1.2 mg/dL (calc)   Alkaline phosphatase (APISO) 106 35 - 144 U/L   AST 28 10 - 35 U/L   ALT 52 (H) 9 - 46 U/L  Basic metabolic panel  Result Value Ref Range   Glucose, Bld 86 65 - 99 mg/dL   BUN 14 7 - 25 mg/dL   Creat 0.98 0.70 - 1.33 mg/dL   BUN/Creatinine Ratio NOT APPLICABLE 6 - 22 (calc)   Sodium 141 135 - 146 mmol/L   Potassium 4.6 3.5 - 5.3 mmol/L   Chloride 103 98 - 110 mmol/L   CO2 28 20 - 32 mmol/L   Calcium 10.0 8.6 - 10.3 mg/dL  CBC with Differential/Platelet  Result Value Ref Range   WBC 5.9 3.8 - 10.8 Thousand/uL   RBC 4.85 4.20 - 5.80 Million/uL   Hemoglobin 15.8 13.2 - 17.1 g/dL   HCT 44.5 38.5 - 50.0 %   MCV 91.8 80.0 - 100.0 fL   MCH 32.6 27.0 - 33.0 pg   MCHC 35.5 32.0 - 36.0 g/dL   RDW 11.8 11.0 - 15.0 %   Platelets 205 140 - 400 Thousand/uL   MPV 10.6 7.5 - 12.5 fL   Neutro Abs 3,174 1,500 - 7,800 cells/uL   Lymphs Abs 1,923 850 - 3,900 cells/uL   Absolute Monocytes 490 200 - 950  cells/uL   Eosinophils Absolute 271 15 - 500 cells/uL   Basophils Absolute 41 0 - 200 cells/uL   Neutrophils Relative % 53.8 %   Total Lymphocyte 32.6 %   Monocytes Relative 8.3 %   Eosinophils Relative 4.6 %   Basophils Relative 0.7 %  Hemoglobin A1c  Result Value Ref Range   Hgb A1c MFr Bld 5.0 <5.7 % of total Hgb   Mean Plasma Glucose 97 (calc)   eAG (mmol/L) 5.4 (calc)  PSA, Total with Reflex to PSA, Free  Result Value Ref Range   PSA, Total 0.3 < OR = 4.0 ng/mL    Assessment and Plan:     ICD-10-CM   1. Healthcare maintenance  Z00.00 Tdap vaccine greater than or equal to 7yo IM  2. Need for prophylactic vaccination with combined diphtheria-tetanus-pertussis (DTP) vaccine  Z23 Tdap vaccine greater than or equal to 7yo IM  3. Screen for colon cancer  Z12.11 Ambulatory referral to Gastroenterology   Work on diet, chol is up Stop smoking  Health Maintenance Exam: The patient's preventative maintenance and recommended screening tests for an annual wellness exam were reviewed in full today. Brought up to date unless services declined.  Counselled on the importance of diet, exercise, and its role in overall health and mortality. The patient's FH and SH was reviewed, including their home life, tobacco status, and drug and alcohol status.  Follow-up in 1 year for physical exam or additional follow-up below.  Follow-up: No follow-ups on file. Or follow-up in 1 year if not noted.  No orders of the defined types were placed in this encounter.  There are no discontinued medications. Orders Placed This Encounter  Procedures  . Tdap vaccine greater than or equal to 7yo IM  . Ambulatory referral to Gastroenterology    Signed,  Frederico Hamman T. Fergie Sherbert, MD   Allergies as of 10/25/2018   No Known Allergies     Medication List  Accurate as of October 25, 2018  3:22 PM. If you have any questions, ask your nurse or doctor.        citalopram 20 MG tablet Commonly known  as: CELEXA TAKE 1 TABLET BY MOUTH EVERY DAY   pantoprazole 40 MG tablet Commonly known as: PROTONIX TAKE 1 TABLET BY MOUTH EVERY DAY   SUMAtriptan 100 MG tablet Commonly known as: IMITREX TAKE 1 TABLET BY MOUTH AS NEEDED FOR MIGRAINE. MAY REPEAT IN 2 HRS IF NEEDED. MAX 2TABS/DAY

## 2018-10-25 ENCOUNTER — Encounter: Payer: Self-pay | Admitting: Family Medicine

## 2018-10-25 ENCOUNTER — Ambulatory Visit (INDEPENDENT_AMBULATORY_CARE_PROVIDER_SITE_OTHER): Payer: BC Managed Care – PPO | Admitting: Family Medicine

## 2018-10-25 ENCOUNTER — Other Ambulatory Visit: Payer: Self-pay

## 2018-10-25 VITALS — BP 100/66 | HR 73 | Temp 98.6°F | Ht 67.5 in | Wt 181.5 lb

## 2018-10-25 DIAGNOSIS — Z0000 Encounter for general adult medical examination without abnormal findings: Secondary | ICD-10-CM | POA: Diagnosis not present

## 2018-10-25 DIAGNOSIS — Z23 Encounter for immunization: Secondary | ICD-10-CM | POA: Diagnosis not present

## 2018-10-25 DIAGNOSIS — Z1211 Encounter for screening for malignant neoplasm of colon: Secondary | ICD-10-CM

## 2018-11-12 ENCOUNTER — Other Ambulatory Visit: Payer: Self-pay | Admitting: Family Medicine

## 2018-11-21 ENCOUNTER — Encounter: Payer: Self-pay | Admitting: *Deleted

## 2019-02-07 ENCOUNTER — Other Ambulatory Visit: Payer: Self-pay

## 2019-02-07 DIAGNOSIS — Z20822 Contact with and (suspected) exposure to covid-19: Secondary | ICD-10-CM

## 2019-02-08 LAB — NOVEL CORONAVIRUS, NAA: SARS-CoV-2, NAA: NOT DETECTED

## 2019-03-01 ENCOUNTER — Other Ambulatory Visit: Payer: Self-pay | Admitting: Family Medicine

## 2019-08-27 ENCOUNTER — Other Ambulatory Visit: Payer: Self-pay | Admitting: Family Medicine

## 2019-09-05 IMAGING — NM NM HEPATOBILIARY IMAGE, INC GB
3 series · 14 of 14 positions shown · non-contrast
Comparison: None

CLINICAL DATA: Nausea with pain for 1-2 weeks.

EXAM:
NUCLEAR MEDICINE HEPATOBILIARY IMAGING
TECHNIQUE: Sequential images of the abdomen were obtained [DATE] minutes
following intravenous administration of radiopharmaceutical.
RADIOPHARMACEUTICALS:  7.4 mCi Kc-NNm  Choletec IV

[Series 1000: gallbladder delays · 3.30mm/px · 2 of 2 slices shown]
[im 1/2]
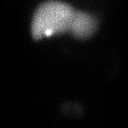
[im 2/2]
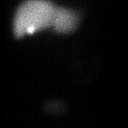

[Series 1000: hepatobiliary scan 2nd hour · 9.59mm/px · 6 of 60 frames shown]
[frame 6/60]
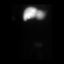
[frame 16/60]
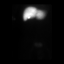
[frame 26/60]
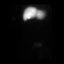
[frame 36/60]
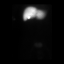
[frame 46/60]
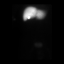
[frame 56/60]
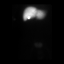

[Series 1000: hepatobiliary scan · 9.59mm/px · 6 of 60 frames shown]
[frame 6/60]
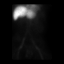
[frame 16/60]
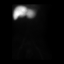
[frame 26/60]
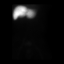
[frame 36/60]
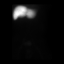
[frame 46/60]
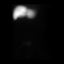
[frame 56/60]
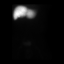

[14 of 14 positions shown; findings below may reference images not displayed]

FINDINGS: There is prompt uptake of the radiopharmaceutical by the liver. In
the first hour there is very mild radiotracer accumulation within
the gallbladder. In the second hour radiotracer accumulation within
the gallbladder becomes more apparent. No biliary to bowel transit
identified within the first or second hour. Delayed imaging was
obtained at 2 hours and 30 minutes which shows radiotracer activity
in the small bowel. Persistent intense radiotracer activity noted
throughout the liver up to 2 hours and 60 minutes.
IMPRESSION: 1. Patent cystic duct without evidence for acute cholecystitis.
2. Delayed and diminished biliary to bowel transit rays is noted.
This raises suspicion of partial obstruction of the common bile
duct. Alternatively, findings may be due to severe hepatocellular
dysfunction.

## 2019-09-10 ENCOUNTER — Ambulatory Visit (INDEPENDENT_AMBULATORY_CARE_PROVIDER_SITE_OTHER): Payer: BC Managed Care – PPO | Admitting: Family Medicine

## 2019-09-10 ENCOUNTER — Other Ambulatory Visit: Payer: Self-pay

## 2019-09-10 ENCOUNTER — Encounter: Payer: Self-pay | Admitting: Family Medicine

## 2019-09-10 ENCOUNTER — Ambulatory Visit (INDEPENDENT_AMBULATORY_CARE_PROVIDER_SITE_OTHER)
Admission: RE | Admit: 2019-09-10 | Discharge: 2019-09-10 | Disposition: A | Discharge status: Home / Self Care | Payer: BC Managed Care – PPO | Source: Ambulatory Visit | Attending: Family Medicine | Admitting: Family Medicine

## 2019-09-10 VITALS — BP 120/78 | HR 82 | Temp 98.3°F | Ht 67.5 in | Wt 179.5 lb

## 2019-09-10 DIAGNOSIS — S99922A Unspecified injury of left foot, initial encounter: Secondary | ICD-10-CM | POA: Diagnosis not present

## 2019-09-10 DIAGNOSIS — M79672 Pain in left foot: Secondary | ICD-10-CM | POA: Diagnosis not present

## 2019-09-10 NOTE — Progress Notes (Signed)
Erva Koke T. Mable Lashley, MD, CAQ Sports Medicine  Primary Care and Sports Medicine [facility] at [facility] [address] [place], [address]  Phone: [phone]  FAX: [phone]  [name] - 54 y.o. male  MRN [id number]  Date of Birth: 1965/05/31  Date: 09/10/2019  PCP: Hannah Beat, MD  Referral: Hannah Beat, MD  Chief Complaint  Patient presents with  . injured foot    left, fell in beach chair, last Tuesday    This visit occurred during the SARS-CoV-2 public health emergency.  Safety protocols were in place, including screening questions prior to the visit, additional usage of staff PPE, and extensive cleaning of exam room while observing appropriate contact time as indicated for disinfecting solutions.   Subjective:   David Sheppard is a 54 y.o. very pleasant male patient with Body mass index is 27.7 kg/m. who presents with the following:  He was at the beach last week and he got his foot caught underneath a chair that was going off of the porch and he sustained some pain.  He has had some pain in the forefoot worse than the first metatarsal, but he also does have some pain in the third.  He does have some swelling without any significant bruising.  He did initially have a subungual hematoma as well.  This appears to have resolved.  Review of Systems is noted in the HPI, as appropriate   Objective:   BP 120/78   Pulse 82   Temp 98.3 F (36.8 C) (Skin)   Ht 5' 7.5" (1.715 m)   Wt 179 lb 8 oz (81.4 kg)   SpO2 98%   BMI 27.70 kg/m    GEN: No acute distress; alert,appropriate. PULM: Breathing comfortably in no respiratory distress PSYCH: Normally interactive.   FEET: Left Echymosis: no Edema: Modest dorsal swelling ROM: full LE B Gait: heel toe, non-antalgic MT pain: Third as well as 1 Callus pattern: none Lateral Mall: NT Medial Mall: NT Talus: NT Navicular: NT Cuboid: NT Calcaneous: NT Metatarsals: NT 5th  MT: NT Phalanges: NT Achilles: NT Plantar Fascia: NT Fat Pad: NT Peroneals: NT Post Tib: NT Great Toe: Nml motion Ant Drawer: neg ATFL: NT CFL: NT Deltoid: NT Other foot breakdown: none Long arch: preserved Transverse arch: preserved Hindfoot breakdown: none Sensation: intact   Radiology: DG Foot Complete Left  Result Date: 09/11/2019 CLINICAL DATA:  Trauma 1 week ago EXAM: LEFT FOOT - COMPLETE 3+ VIEW COMPARISON:  None. FINDINGS: Frontal, oblique, lateral views of the left foot are obtained. No fracture, subluxation, or dislocation. Mild joint space narrowing of the first metatarsal phalangeal joint. Soft tissues are grossly unremarkable. IMPRESSION: 1. 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 2. No acute displaced fracture. Electronically Signed   By: Sharlet Salina M.D.   On: 09/11/2019 01:31     Assessment and Plan:     ICD-10-CM   1. Acute foot pain, left  M79.672 DG Foot Complete Left   No fracture.  Reassurance.  Resolve likely 2 weeks.  Bone bruise.  Follow-up: No follow-ups on file.  No orders of the defined types were placed in this encounter.  There are no discontinued medications. Orders Placed This Encounter  Procedures  . DG Foot Complete Left    Signed,  Athen Riel T. Zymier Rodgers, MD   Outpatient Encounter Medications as of 09/10/2019  Medication Sig  . citalopram (CELEXA) 20 MG tablet TAKE 1 TABLET BY MOUTH EVERY DAY  .  pantoprazole (PROTONIX) 40 MG tablet TAKE 1 TABLET BY MOUTH EVERY DAY  . SUMAtriptan (IMITREX) 100 MG tablet TAKE 1 TABLET BY MOUTH AS NEEDED FOR MIGRAINE. MAY REPEAT IN 2 HRS IF NEEDED. MAX 2TABS/DAY   No facility-administered encounter medications on file as of 09/10/2019.

## 2019-09-11 ENCOUNTER — Encounter: Payer: Self-pay | Admitting: Family Medicine

## 2019-10-21 ENCOUNTER — Telehealth: Payer: Self-pay | Admitting: Family Medicine

## 2019-10-22 NOTE — Telephone Encounter (Signed)
Please schedule CPE with fasting labs prior for Dr. Copland.  

## 2019-10-31 NOTE — Telephone Encounter (Signed)
Labs 9/3 cpx 9/9 Pt aware

## 2019-11-18 ENCOUNTER — Other Ambulatory Visit: Payer: Self-pay | Admitting: Family Medicine

## 2019-11-22 ENCOUNTER — Other Ambulatory Visit (INDEPENDENT_AMBULATORY_CARE_PROVIDER_SITE_OTHER): Payer: BC Managed Care – PPO

## 2019-11-22 ENCOUNTER — Other Ambulatory Visit: Payer: Self-pay

## 2019-11-22 DIAGNOSIS — Z125 Encounter for screening for malignant neoplasm of prostate: Secondary | ICD-10-CM

## 2019-11-22 DIAGNOSIS — Z79899 Other long term (current) drug therapy: Secondary | ICD-10-CM | POA: Diagnosis not present

## 2019-11-22 DIAGNOSIS — Z131 Encounter for screening for diabetes mellitus: Secondary | ICD-10-CM

## 2019-11-22 DIAGNOSIS — E785 Hyperlipidemia, unspecified: Secondary | ICD-10-CM | POA: Diagnosis not present

## 2019-11-23 LAB — CBC WITH DIFFERENTIAL/PLATELET
Basophils Absolute: 0.1 10*3/uL (ref 0.0–0.1)
Basophils Relative: 1.1 % (ref 0.0–3.0)
Eosinophils Absolute: 0.4 10*3/uL (ref 0.0–0.7)
Eosinophils Relative: 6.3 % — ABNORMAL HIGH (ref 0.0–5.0)
HCT: 44 % (ref 39.0–52.0)
Hemoglobin: 15.1 g/dL (ref 13.0–17.0)
Lymphocytes Relative: 33.4 % (ref 12.0–46.0)
Lymphs Abs: 2.4 10*3/uL (ref 0.7–4.0)
MCHC: 34.4 g/dL (ref 30.0–36.0)
MCV: 95.7 fl (ref 78.0–100.0)
Monocytes Absolute: 0.5 10*3/uL (ref 0.1–1.0)
Monocytes Relative: 7.2 % (ref 3.0–12.0)
Neutro Abs: 3.7 10*3/uL (ref 1.4–7.7)
Neutrophils Relative %: 52 % (ref 43.0–77.0)
Platelets: 175 10*3/uL (ref 150.0–400.0)
RBC: 4.6 Mil/uL (ref 4.22–5.81)
RDW: 12.5 % (ref 11.5–15.5)
WBC: 7 10*3/uL (ref 4.0–10.5)

## 2019-11-23 LAB — HEPATIC FUNCTION PANEL
ALT: 48 U/L (ref 0–53)
AST: 31 U/L (ref 0–37)
Albumin: 4.3 g/dL (ref 3.5–5.2)
Alkaline Phosphatase: 96 U/L (ref 39–117)
Bilirubin, Direct: 0.1 mg/dL (ref 0.0–0.3)
Total Bilirubin: 0.3 mg/dL (ref 0.2–1.2)
Total Protein: 6.8 g/dL (ref 6.0–8.3)

## 2019-11-23 LAB — LIPID PANEL
Cholesterol: 202 mg/dL — ABNORMAL HIGH (ref 0–200)
HDL: 44.9 mg/dL (ref >39.00)
LDL Cholesterol: 118 mg/dL — ABNORMAL HIGH (ref 0–99)
NonHDL: 157.29
Total CHOL/HDL Ratio: 5
Triglycerides: 195 mg/dL — ABNORMAL HIGH (ref 0.0–149.0)
VLDL: 39 mg/dL (ref 0.0–40.0)

## 2019-11-23 LAB — BASIC METABOLIC PANEL
BUN: 11 mg/dL (ref 6–23)
CO2: 26 mEq/L (ref 19–32)
Calcium: 9.3 mg/dL (ref 8.4–10.5)
Chloride: 103 mEq/L (ref 96–112)
Creatinine, Ser: 1.04 mg/dL (ref 0.40–1.50)
GFR: 74.93 mL/min (ref >60.00)
Glucose, Bld: 92 mg/dL (ref 70–99)
Potassium: 4.3 mEq/L (ref 3.5–5.1)
Sodium: 139 mEq/L (ref 135–145)

## 2019-11-23 LAB — HEMOGLOBIN A1C: Hgb A1c MFr Bld: 5.3 % (ref 4.6–6.5)

## 2019-11-23 LAB — PSA, TOTAL WITH REFLEX TO PSA, FREE: PSA, Total: 0.3 ng/mL (ref <=4.0)

## 2019-11-26 ENCOUNTER — Ambulatory Visit (INDEPENDENT_AMBULATORY_CARE_PROVIDER_SITE_OTHER): Payer: BC Managed Care – PPO | Admitting: Family Medicine

## 2019-11-26 ENCOUNTER — Other Ambulatory Visit: Payer: Self-pay

## 2019-11-26 ENCOUNTER — Encounter: Payer: Self-pay | Admitting: Family Medicine

## 2019-11-26 VITALS — BP 106/70 | HR 86 | Temp 98.1°F | Ht 67.5 in | Wt 181.5 lb

## 2019-11-26 DIAGNOSIS — Z0000 Encounter for general adult medical examination without abnormal findings: Secondary | ICD-10-CM | POA: Diagnosis not present

## 2019-11-26 DIAGNOSIS — Z1211 Encounter for screening for malignant neoplasm of colon: Secondary | ICD-10-CM

## 2019-11-26 NOTE — Patient Instructions (Signed)
At [facility], we are fully committed to providing effective and safe vaccines to eliminate COVID-19 in our communities.  Visit [email or web address] for the most up-to-date vaccine updates.   - Vaccines Are Available for Ages 12 and Above   - [facility] has vaccine clinic locations in [place], [place] and [place] counties for anyone 12 and over, with appointment    scheduling information in the link below:   - [email or web address]  For assistance in finding a vaccine clinic near you, please call [phone].  Find all vaccine locations throughout the state of [place] at [email or web address]  Additional Vaccine?  What you Need to Know   - CDC recommends that people with moderately to severely compromised immune systems receive an additional dose of mRNA COVID-19 vaccine at least 28 days after a second dose of Pfizer-BioNTech COVID-19 vaccine or Moderna COVID-19 vaccine.   - This additional dose intended to improve immunocompromised people's response to their initial vaccine series is not the same as a booster dose, given to people when the immune response to a primary vaccine series is likely to have waned over time.  Who Needs an Additional COVID-19 Vaccine? Currently, CDC is recommending that moderately to severely immunocompromised people receive an additional dose. This includes people who have:  - Been receiving active cancer treatment for tumors or cancers of the blood  - Received an organ transplant and are taking medicine to suppress the immune system  - Received a stem cell transplant within the last 2 years or are taking medicine to suppress the immune system  - Moderate or severe primary immunodeficiency (such as DiGeorge syndrome, Wiskott-Aldrich syndrome)  - Advanced or untreated HIV infection  - Active treatment with high-dose corticosteroids or other drugs that may suppress  your immune response  People should talk to their healthcare provider about their medical condition, and whether getting an additional dose is appropriate for them  CDC does not recommend additional doses or booster shots for any other population at this time.  Following the FDA's authorization and CDC recommendation that those with moderately to severely compromised immune systems receive a third dose of the COVID-19 vaccine, [facility] plans to provide the additional vaccines at current vaccine clinics.   

## 2019-11-26 NOTE — Progress Notes (Signed)
Porter Nakama T. Laurita Peron, MD, CAQ Sports Medicine  Primary Care and Sports Medicine [facility] at [facility] [address] [place], [address]  Phone: [phone]  FAX: [phone]  David Sheppard - 54 y.o. male  MRN [id number]  Date of Birth: 1966/02/07  Date: 11/26/2019  PCP: Hannah Beat, MD  Referral: Hannah Beat, MD  Chief Complaint  Patient presents with  . Annual Exam    This visit occurred during the SARS-CoV-2 public health emergency.  Safety protocols were in place, including screening questions prior to the visit, additional usage of staff PPE, and extensive cleaning of exam room while observing appropriate contact time as indicated for disinfecting solutions.   Patient Care Team: Hannah Beat, MD as PCP - General (Family Medicine) Subjective:   David Sheppard is a 54 y.o. pleasant patient who presents with the following:  Preventative Health Maintenance Visit:  Health Maintenance Summary Reviewed and updated, unless pt declines services.  Tobacco History Reviewed. Alcohol: No concerns, no excessive use Exercise Habits: Some activity, rec at least 30 mins 5 times a week STD concerns: no risk or activity to increase risk Drug Use: None  Covid-19 Vaccine Flu Colon cancer screening Colon never done  Health Maintenance  Topic Date Due  . Hepatitis C Screening  Never done  . COVID-19 Vaccine (1) Never done  . COLONOSCOPY  Never done  . INFLUENZA VACCINE  06/12/2020 (Originally 10/14/2019)  . TETANUS/TDAP  10/24/2028  . HIV Screening  Completed   Immunization History  Administered Date(s) Administered  . Td 06/03/2008  . Tdap 10/25/2018   Patient Active Problem List   Diagnosis Date Noted  . Testicle tenderness 05/28/2018  . Gallstone pancreatitis   . Increased liver enzymes   . Generalized anxiety disorder 07/30/2011  . Panic attacks 07/30/2011  . GERD 06/21/2008  . MIGRAINE HEADACHE 06/04/2008  . ALLERGIC  RHINITIS 06/04/2008    Past Medical History:  Diagnosis Date  . Allergy   . Generalized anxiety disorder 07/30/2011  . GERD (gastroesophageal reflux disease)   . Migraine   . Panic attacks 07/30/2011    Past Surgical History:  Procedure Laterality Date  . CHOLECYSTECTOMY N/A 03/17/2018   Procedure: LAPAROSCOPIC CHOLECYSTECTOMY;  Surgeon: Duanne Guess, MD;  Location: [facility] ORS;  Service: General;  Laterality: N/A;  . HERNIA REPAIR    . TONSILLECTOMY AND ADENOIDECTOMY      Family History  Problem Relation Age of Onset  . Hypertension Father   . Heart disease Paternal Grandfather   . Hyperlipidemia Paternal Grandfather   . Diabetes Paternal Grandfather   . Hyperlipidemia Paternal Grandmother   . Diabetes Paternal Grandmother     Past Medical History, Surgical History, Social History, Family History, Problem List, Medications, and Allergies have been reviewed and updated if relevant.  Review of Systems: Pertinent positives are listed above.  Otherwise, a full 14 point review of systems has been done in full and it is negative except where it is noted positive.  Objective:   BP 106/70   Pulse 86   Temp 98.1 F (36.7 C) (Temporal)   Ht 5' 7.5" (1.715 m)   Wt 181 lb 8 oz (82.3 kg)   SpO2 95%   BMI 28.01 kg/m  Ideal Body Weight: Weight in (lb) to have BMI = 25: 161.7  Ideal Body Weight: Weight in (lb) to have BMI = 25: 161.7 No exam data present Depression screen [facility] 2/9 11/26/2019 10/25/2018 08/04/2017  Decreased Interest 0 0  0  Down, Depressed, Hopeless 0 0 0  PHQ - 2 Score 0 0 0     GEN: well developed, well nourished, no acute distress Eyes: conjunctiva and lids normal, PERRLA, EOMI ENT: TM clear, nares clear, oral exam WNL Neck: supple, no lymphadenopathy, no thyromegaly, no JVD Pulm: clear to auscultation and percussion, respiratory effort normal CV: regular rate and rhythm, S1-S2, no murmur, rub or gallop, no bruits, peripheral pulses normal and symmetric, no  cyanosis, clubbing, edema or varicosities GI: soft, non-tender; no hepatosplenomegaly, masses; active bowel sounds all quadrants GU: no hernia, testicular mass, penile discharge Lymph: no cervical, axillary or inguinal adenopathy MSK: gait normal, muscle tone and strength WNL, no joint swelling, effusions, discoloration, crepitus  SKIN: clear, good turgor, color WNL, no rashes, lesions, or ulcerations Neuro: normal mental status, normal strength, sensation, and motion Psych: alert; oriented to person, place and time, normally interactive and not anxious or depressed in appearance.  All labs reviewed with patient. Results for orders placed or performed in visit on 11/22/19  Lipid panel  Result Value Ref Range   Cholesterol 202 (H) 0 - 200 mg/dL   Triglycerides 259.5 (H) 0 - 149 mg/dL   HDL 63.87 >56.43 mg/dL   VLDL 32.9 0.0 - 51.8 mg/dL   LDL Cholesterol 841 (H) 0 - 99 mg/dL   Total CHOL/HDL Ratio 5    NonHDL 157.29   CBC with Differential/Platelet  Result Value Ref Range   WBC 7.0 4.0 - 10.5 K/uL   RBC 4.60 4.22 - 5.81 Mil/uL   Hemoglobin 15.1 13.0 - 17.0 g/dL   HCT 66.0 39 - 52 %   MCV 95.7 78.0 - 100.0 fl   MCHC 34.4 30.0 - 36.0 g/dL   RDW 63.0 16.0 - 10.9 %   Platelets 175.0 150 - 400 K/uL   Neutrophils Relative % 52.0 43 - 77 %   Lymphocytes Relative 33.4 12 - 46 %   Monocytes Relative 7.2 3 - 12 %   Eosinophils Relative 6.3 (H) 0 - 5 %   Basophils Relative 1.1 0 - 3 %   Neutro Abs 3.7 1.4 - 7.7 K/uL   Lymphs Abs 2.4 0.7 - 4.0 K/uL   Monocytes Absolute 0.5 0 - 1 K/uL   Eosinophils Absolute 0.4 0 - 0 K/uL   Basophils Absolute 0.1 0 - 0 K/uL  Hepatic function panel  Result Value Ref Range   Total Bilirubin 0.3 0.2 - 1.2 mg/dL   Bilirubin, Direct 0.1 0.0 - 0.3 mg/dL   Alkaline Phosphatase 96 39 - 117 U/L   AST 31 0 - 37 U/L   ALT 48 0 - 53 U/L   Total Protein 6.8 6.0 - 8.3 g/dL   Albumin 4.3 3.5 - 5.2 g/dL  Basic metabolic panel  Result Value Ref Range   Sodium 139  135 - 145 mEq/L   Potassium 4.3 3.5 - 5.1 mEq/L   Chloride 103 96 - 112 mEq/L   CO2 26 19 - 32 mEq/L   Glucose, Bld 92 70 - 99 mg/dL   BUN 11 6 - 23 mg/dL   Creatinine, Ser 3.23 0.40 - 1.50 mg/dL   GFR 55.73 >22.02 mL/min   Calcium 9.3 8.4 - 10.5 mg/dL  Hemoglobin R4Y  Result Value Ref Range   Hgb A1c MFr Bld 5.3 4.6 - 6.5 %  PSA, Total with Reflex to PSA, Free  Result Value Ref Range   PSA, Total 0.3 < OR = 4.0 ng/mL  Assessment and Plan:     ICD-10-CM   1. Healthcare maintenance  Z00.00    He declines a flu shot He is going to think about COVID-19 vaccine, but at this point he is not 100% sure.  He states that he will have to get one for work.  Colonoscopy referral.  Health Maintenance Exam: The patient's preventative maintenance and recommended screening tests for an annual wellness exam were reviewed in full today. Brought up to date unless services declined.  Counselled on the importance of diet, exercise, and its role in overall health and mortality. The patient's FH and SH was reviewed, including their home life, tobacco status, and drug and alcohol status.  Follow-up in 1 year for physical exam or additional follow-up below.  Follow-up: No follow-ups on file. Or follow-up in 1 year if not noted.  No orders of the defined types were placed in this encounter.  There are no discontinued medications. No orders of the defined types were placed in this encounter.   Signed,  Elpidio Galea. Yarelin Reichardt, MD   Allergies as of 11/26/2019   No Known Allergies     Medication List       Accurate as of November 26, 2019  2:58 PM. If you have any questions, ask your nurse or doctor.        citalopram 20 MG tablet Commonly known as: CELEXA TAKE 1 TABLET BY MOUTH EVERY DAY   pantoprazole 40 MG tablet Commonly known as: PROTONIX TAKE 1 TABLET BY MOUTH EVERY DAY   SUMAtriptan 100 MG tablet Commonly known as: IMITREX TAKE 1 TABLET BY MOUTH AS NEEDED FOR MIGRAINE.  MAY REPEAT IN 2 HRS IF NEEDED. MAX 2TABS/DAY

## 2019-11-27 ENCOUNTER — Other Ambulatory Visit: Payer: Self-pay | Admitting: Family Medicine

## 2019-11-28 ENCOUNTER — Encounter: Payer: Self-pay | Admitting: Family Medicine

## 2019-11-29 MED ORDER — SCOPOLAMINE 1 MG/3DAYS TD PT72: 1.0000 | MEDICATED_PATCH | TRANSDERMAL | 1 refills | Status: DC

## 2020-01-14 ENCOUNTER — Other Ambulatory Visit: Payer: Self-pay | Admitting: Family Medicine

## 2020-02-25 ENCOUNTER — Encounter: Payer: Self-pay | Admitting: Family Medicine

## 2020-02-26 ENCOUNTER — Ambulatory Visit (INDEPENDENT_AMBULATORY_CARE_PROVIDER_SITE_OTHER): Payer: BC Managed Care – PPO | Admitting: Family Medicine

## 2020-02-26 ENCOUNTER — Other Ambulatory Visit: Payer: Self-pay

## 2020-02-26 ENCOUNTER — Encounter: Payer: Self-pay | Admitting: Family Medicine

## 2020-02-26 VITALS — BP 122/78 | HR 80 | Temp 98.1°F | Wt 185.8 lb

## 2020-02-26 DIAGNOSIS — M79675 Pain in left toe(s): Secondary | ICD-10-CM | POA: Diagnosis not present

## 2020-02-26 DIAGNOSIS — M7989 Other specified soft tissue disorders: Secondary | ICD-10-CM

## 2020-02-26 NOTE — Patient Instructions (Signed)
Ibuprofen 800 mg three times daily  OR Aleve 500 mg twice daily  Take for 3-7 days   If not improved or worsening return to clinic

## 2020-02-26 NOTE — Progress Notes (Signed)
Subjective:     David Sheppard is a 54 y.o. male presenting for Foot Pain (L upon weight bearing x 1 week )     HPI  #Left foot pain - injury in June - saw Dr. Patsy Lager - no cause for symptoms - resolved within 1-2 weeks - last Monday pain returned - pain is the forefoot - still has bruising on the toenail from the injury - no recent injury but pain returned in the same area - no changes in activity  - does walk a lot at work, but not more than normal - pain is sharp with pressure - treatment: ice, epson salt and warm water, ace bandage made it worse - tylenol w/o improvement  Hx of stomach ulcer with bleeding  Review of Systems   Social History   Tobacco Use  Smoking Status Current Every Day Smoker  . Packs/day: 0.50  . Years: 10.00  . Pack years: 5.00  . Types: Cigarettes  Smokeless Tobacco Former User        Objective:    BP Readings from Last 3 Encounters:  02/26/20 122/78  11/26/19 106/70  09/10/19 120/78   Wt Readings from Last 3 Encounters:  02/26/20 185 lb 12 oz (84.3 kg)  11/26/19 181 lb 8 oz (82.3 kg)  09/10/19 179 lb 8 oz (81.4 kg)    BP 122/78   Pulse 80   Temp 98.1 F (36.7 C) (Temporal)   Wt 185 lb 12 oz (84.3 kg)   SpO2 99%   BMI 28.66 kg/m    Physical Exam Constitutional:      Appearance: Normal appearance. He is not ill-appearing or diaphoretic.  HENT:     Right Ear: External ear normal.     Left Ear: External ear normal.  Eyes:     General: No scleral icterus.    Extraocular Movements: Extraocular movements intact.     Conjunctiva/sclera: Conjunctivae normal.  Cardiovascular:     Rate and Rhythm: Normal rate.  Pulmonary:     Effort: Pulmonary effort is normal.  Musculoskeletal:     Cervical back: Neck supple.     Comments: Left foot Bruising on the great toenail Swelling of the 2nd toe with TTP along the proximal metatarsal joint. Mild erythema of the foot compared to the right and swelling along the forefoot  area Bilateral Bunions. Normal strength, pulses.   Skin:    General: Skin is warm and dry.  Neurological:     Mental Status: He is alert. Mental status is at baseline.  Psychiatric:        Mood and Affect: Mood normal.           Assessment & Plan:   Problem List Items Addressed This Visit      Other   Pain and swelling of toe of left foot - Primary    Etiology not entirely clear. Does have remote injury in same area but has gone months w/o pain. Discussed possibility of gout and advised NSAID treatment. Previous XR w/o fracture though did have joint line TTP. If not improved would consider blood work and XR.           Return in about 1 week (around 03/04/2020), or if symptoms worsen or fail to improve.  Lynnda Child, MD  This visit occurred during the SARS-CoV-2 public health emergency.  Safety protocols were in place, including screening questions prior to the visit, additional usage of staff PPE, and extensive cleaning of exam room  while observing appropriate contact time as indicated for disinfecting solutions.

## 2020-02-26 NOTE — Assessment & Plan Note (Signed)
Etiology not entirely clear. Does have remote injury in same area but has gone months w/o pain. Discussed possibility of gout and advised NSAID treatment. Previous XR w/o fracture though did have joint line TTP. If not improved would consider blood work and XR.

## 2020-03-02 DIAGNOSIS — M79675 Pain in left toe(s): Secondary | ICD-10-CM | POA: Diagnosis not present

## 2020-03-05 ENCOUNTER — Ambulatory Visit (INDEPENDENT_AMBULATORY_CARE_PROVIDER_SITE_OTHER): Payer: BC Managed Care – PPO | Admitting: Podiatry

## 2020-03-05 ENCOUNTER — Encounter: Payer: Self-pay | Admitting: Podiatry

## 2020-03-05 ENCOUNTER — Ambulatory Visit (INDEPENDENT_AMBULATORY_CARE_PROVIDER_SITE_OTHER): Payer: BC Managed Care – PPO

## 2020-03-05 ENCOUNTER — Other Ambulatory Visit: Payer: Self-pay

## 2020-03-05 DIAGNOSIS — M109 Gout, unspecified: Secondary | ICD-10-CM

## 2020-03-05 DIAGNOSIS — R600 Localized edema: Secondary | ICD-10-CM

## 2020-03-05 DIAGNOSIS — M779 Enthesopathy, unspecified: Secondary | ICD-10-CM | POA: Diagnosis not present

## 2020-03-05 DIAGNOSIS — M21619 Bunion of unspecified foot: Secondary | ICD-10-CM | POA: Diagnosis not present

## 2020-03-05 MED ORDER — TRIAMCINOLONE ACETONIDE 10 MG/ML IJ SUSP
10.0000 mg | Freq: Once | INTRAMUSCULAR | Status: AC
  Administered 2020-03-05: 17:00:00 10 mg

## 2020-03-10 NOTE — Progress Notes (Signed)
Subjective:   Patient ID: David Sheppard, male   DOB: 54 y.o.   MRN: [id number]   HPI Patient presents stating he developed a lot of pain and redness around his big toe joint left and states that it is been very inflamed recently and he does not remember specific injury.  States he is having trouble wearing shoe gear and does not smoke likes to be active   Review of Systems  All other systems reviewed and are negative.       Objective:  Physical Exam Vitals and nursing note reviewed.  Constitutional:      Appearance: He is well-developed and well-nourished.  Cardiovascular:     Pulses: Intact distal pulses.  Pulmonary:     Effort: Pulmonary effort is normal.  Musculoskeletal:        General: Normal range of motion.  Skin:    General: Skin is warm.  Neurological:     Mental Status: He is alert.     Neurovascular status intact muscle strength was found to be adequate redness and swelling around the first MPJ left with fluid buildup around the joint and painful with palpation.  Patient also appears to have moderate deformity noted and has diminished range of motion secondary to the pain     Assessment:  Inflammatory capsulitis first MPJ left with strong possibility that this may be gout     Plan:  H&P x-ray reviewed gout education rendered and given to patient.  Today I did sterile prep and injected around the first MPJ periarticular 3 mg Kenalog 5 mg Xylocaine placed on 6-day steroid Dosepak and advised on soaks rigid bottom shoes and if symptoms persist or he gets another attack we will get blood work and probably start him on medication.  Patient is encouraged to call with questions concerns or if he does not improve  X-rays indicate no indications of osteolysis or indications of arthritis or spur formation around the big toe joint

## 2020-03-17 ENCOUNTER — Ambulatory Visit (INDEPENDENT_AMBULATORY_CARE_PROVIDER_SITE_OTHER): Payer: BC Managed Care – PPO | Admitting: Podiatry

## 2020-03-17 ENCOUNTER — Other Ambulatory Visit: Payer: Self-pay | Admitting: Podiatry

## 2020-03-17 ENCOUNTER — Other Ambulatory Visit: Payer: Self-pay

## 2020-03-17 DIAGNOSIS — M069 Rheumatoid arthritis, unspecified: Secondary | ICD-10-CM | POA: Diagnosis not present

## 2020-03-17 DIAGNOSIS — M109 Gout, unspecified: Secondary | ICD-10-CM

## 2020-03-17 DIAGNOSIS — M779 Enthesopathy, unspecified: Secondary | ICD-10-CM

## 2020-03-17 MED ORDER — DICLOFENAC SODIUM 75 MG PO TBEC: 75.0000 mg | DELAYED_RELEASE_TABLET | Freq: Two times a day (BID) | ORAL | 2 refills | Status: DC

## 2020-03-18 ENCOUNTER — Encounter: Payer: Self-pay | Admitting: Podiatry

## 2020-03-18 ENCOUNTER — Encounter: Payer: Self-pay | Admitting: Family Medicine

## 2020-03-18 LAB — CBC WITH DIFFERENTIAL/PLATELET
Basophils Absolute: 0.1 10*3/uL (ref 0.0–0.2)
Basos: 1 %
EOS (ABSOLUTE): 0.1 10*3/uL (ref 0.0–0.4)
Eos: 2 %
Hematocrit: 47.6 % (ref 37.5–51.0)
Hemoglobin: 17.3 g/dL (ref 13.0–17.7)
Immature Grans (Abs): 0 10*3/uL (ref 0.0–0.1)
Immature Granulocytes: 0 %
Lymphocytes Absolute: 1.9 10*3/uL (ref 0.7–3.1)
Lymphs: 23 %
MCH: 33 pg (ref 26.6–33.0)
MCHC: 36.3 g/dL — ABNORMAL HIGH (ref 31.5–35.7)
MCV: 91 fL (ref 79–97)
Monocytes Absolute: 0.5 10*3/uL (ref 0.1–0.9)
Monocytes: 6 %
Neutrophils Absolute: 5.7 10*3/uL (ref 1.4–7.0)
Neutrophils: 68 %
Platelets: 209 10*3/uL (ref 150–450)
RBC: 5.24 x10E6/uL (ref 4.14–5.80)
RDW: 11.4 % — ABNORMAL LOW (ref 11.6–15.4)
WBC: 8.4 10*3/uL (ref 3.4–10.8)

## 2020-03-18 LAB — SEDIMENTATION RATE: Sed Rate: 3 mm/hr (ref 0–30)

## 2020-03-18 LAB — ANTINUCLEAR ANTIBODIES, IFA: ANA Titer 1: NEGATIVE

## 2020-03-18 LAB — C-REACTIVE PROTEIN: CRP: 2 mg/L (ref 0–10)

## 2020-03-18 LAB — URIC ACID: Uric Acid: 6 mg/dL (ref 3.8–8.4)

## 2020-03-18 NOTE — Progress Notes (Signed)
Subjective:   Patient ID: David Sheppard, male   DOB: 55 y.o.   MRN: [id number]   HPI Patient presents stating he is developing a lot of pain in the dorsum of his left foot and states that it started to flareup on him again as it had prior   ROS      Objective:  Physical Exam  Neurovascular status intact with patient's left forefoot around the second and third MPJ being swollen and painful with no pain in the metatarsal shafts themselves currently     Assessment:  Probability for an inflammatory capsulitis with possibility for gout of the left forefoot     Plan:  H&P reviewed conditions x-ray and today I did sterile prep and I injected periarticular around the second and third MPJ 3 mg Dexasone Kenalog 5 mg Xylocaine and I advised on anti-inflammatories that I placed him on today and we will get blood work to rule out gout or other pathology including infection.  No drainage or other pathology noted currently  X-ray was negative for signs of osteolysis or indications of fracture currently

## 2020-03-19 LAB — RHEUMATOID ARTHRITIS PROFILE
Cyclic Citrullin Peptide Ab: 6 units (ref 0–19)
Rheumatoid fact SerPl-aCnc: 10.7 IU/mL (ref <14.0)

## 2020-03-20 ENCOUNTER — Telehealth: Payer: Self-pay

## 2020-03-20 NOTE — Telephone Encounter (Signed)
This pt has called twice and has send a message on my chart. The pt was in the office on Monday. He received antiinflammatory and doesn't feel that they are working. The pt had his blood work completed and noticed that his lab results were back and would like those explained. The pt stated that he is still experiencing a lot of pain. He works 8 hours a day standing on his feet. He would like some guidances on the next step. Please advise.

## 2020-03-20 NOTE — Telephone Encounter (Signed)
His blood work was normal. He may need a boot if he is not improving

## 2020-03-31 ENCOUNTER — Ambulatory Visit: Payer: BC Managed Care – PPO | Admitting: Podiatry

## 2020-04-19 ENCOUNTER — Other Ambulatory Visit: Payer: Self-pay | Admitting: Family Medicine

## 2020-04-29 ENCOUNTER — Other Ambulatory Visit: Payer: Self-pay

## 2020-04-29 ENCOUNTER — Encounter: Payer: Self-pay | Admitting: Internal Medicine

## 2020-04-29 ENCOUNTER — Ambulatory Visit (INDEPENDENT_AMBULATORY_CARE_PROVIDER_SITE_OTHER): Payer: BC Managed Care – PPO | Admitting: Internal Medicine

## 2020-04-29 VITALS — BP 124/84 | HR 74 | Temp 97.8°F | Wt 189.0 lb

## 2020-04-29 DIAGNOSIS — M5441 Lumbago with sciatica, right side: Secondary | ICD-10-CM

## 2020-04-29 MED ORDER — CYCLOBENZAPRINE HCL 10 MG PO TABS: 10.0000 mg | ORAL_TABLET | Freq: Three times a day (TID) | ORAL | 0 refills | Status: DC | PRN

## 2020-04-29 MED ORDER — PREDNISONE 10 MG PO TABS: ORAL_TABLET | ORAL | 0 refills | Status: DC

## 2020-04-29 NOTE — Patient Instructions (Signed)
Sciatica Rehab Ask your health care provider which exercises are safe for you. Do exercises exactly as told by your health care provider and adjust them as directed. It is normal to feel mild stretching, pulling, tightness, or discomfort as you do these exercises. Stop right away if you feel sudden pain or your pain gets worse. Do not begin these exercises until told by your health care provider. Stretching and range-of-motion exercises These exercises warm up your muscles and joints and improve the movement and flexibility of your hips and back. These exercises also help to relieve pain, numbness, and tingling. Sciatic nerve glide 1. Sit in a chair with your head facing down toward your chest. Place your hands behind your back. Let your shoulders slump forward. 2. Slowly straighten one of your legs while you tilt your head back as if you are looking toward the ceiling. Only straighten your leg as far as you can without making your symptoms worse. 3. Hold this position for __________ seconds. 4. Slowly return to the starting position. 5. Repeat with your other leg. Repeat __________ times. Complete this exercise __________ times a day. Knee to chest with hip adduction and internal rotation 1. Lie on your back on a firm surface with both legs straight. 2. Bend one of your knees and move it up toward your chest until you feel a gentle stretch in your lower back and buttock. Then, move your knee toward the shoulder that is on the opposite side from your leg. This is hip adduction and internal rotation. ? Hold your leg in this position by holding on to the front of your knee. 3. Hold this position for __________ seconds. 4. Slowly return to the starting position. 5. Repeat with your other leg. Repeat __________ times. Complete this exercise __________ times a day.   Prone extension on elbows 1. Lie on your abdomen on a firm surface. A bed may be too soft for this exercise. 2. Prop yourself up on  your elbows. 3. Use your arms to help lift your chest up until you feel a gentle stretch in your abdomen and your lower back. ? This will place some of your body weight on your elbows. If this is uncomfortable, try stacking pillows under your chest. ? Your hips should stay down, against the surface that you are lying on. Keep your hip and back muscles relaxed. 4. Hold this position for __________ seconds. 5. Slowly relax your upper body and return to the starting position. Repeat __________ times. Complete this exercise __________ times a day.   Strengthening exercises These exercises build strength and endurance in your back. Endurance is the ability to use your muscles for a long time, even after they get tired. Pelvic tilt This exercise strengthens the muscles that lie deep in the abdomen. 1. Lie on your back on a firm surface. Bend your knees and keep your feet flat on the floor. 2. Tense your abdominal muscles. Tip your pelvis up toward the ceiling and flatten your lower back into the floor. ? To help with this exercise, you may place a small towel under your lower back and try to push your back into the towel. 3. Hold this position for __________ seconds. 4. Let your muscles relax completely before you repeat this exercise. Repeat __________ times. Complete this exercise __________ times a day. Alternating arm and leg raises 1. Get on your hands and knees on a firm surface. If you are on a hard floor, you may want to   use padding, such as an exercise mat, to cushion your knees. 2. Line up your arms and legs. Your hands should be directly below your shoulders, and your knees should be directly below your hips. 3. Lift your left leg behind you. At the same time, raise your right arm and straighten it in front of you. ? Do not lift your leg higher than your hip. ? Do not lift your arm higher than your shoulder. ? Keep your abdominal and back muscles tight. ? Keep your hips facing the  ground. ? Do not arch your back. ? Keep your balance carefully, and do not hold your breath. 4. Hold this position for __________ seconds. 5. Slowly return to the starting position. 6. Repeat with your right leg and your left arm. Repeat __________ times. Complete this exercise __________ times a day.   Posture and body mechanics Good posture and healthy body mechanics can help to relieve stress in your body's tissues and joints. Body mechanics refers to the movements and positions of your body while you do your daily activities. Posture is part of body mechanics. Good posture means:  Your spine is in its natural S-curve position (neutral).  Your shoulders are pulled back slightly.  Your head is not tipped forward. Follow these guidelines to improve your posture and body mechanics in your everyday activities. Standing  When standing, keep your spine neutral and your feet about hip width apart. Keep a slight bend in your knees. Your ears, shoulders, and hips should line up.  When you do a task in which you stand in one place for a long time, place one foot up on a stable object that is 2-4 inches (5-10 cm) high, such as a footstool. This helps keep your spine neutral.   Sitting  When sitting, keep your spine neutral and keep your feet flat on the floor. Use a footrest, if necessary, and keep your thighs parallel to the floor. Avoid rounding your shoulders, and avoid tilting your head forward.  When working at a desk or a computer, keep your desk at a height where your hands are slightly lower than your elbows. Slide your chair under your desk so you are close enough to maintain good posture.  When working at a computer, place your monitor at a height where you are looking straight ahead and you do not have to tilt your head forward or downward to look at the screen.   Resting  When lying down and resting, avoid positions that are most painful for you.  If you have pain with activities  such as sitting, bending, stooping, or squatting, lie in a position in which your body does not bend very much. For example, avoid curling up on your side with your arms and knees near your chest (fetal position).  If you have pain with activities such as standing for a long time or reaching with your arms, lie with your spine in a neutral position and bend your knees slightly. Try the following positions: ? Lying on your side with a pillow between your knees. ? Lying on your back with a pillow under your knees. Lifting  When lifting objects, keep your feet at least shoulder width apart and tighten your abdominal muscles.  Bend your knees and hips and keep your spine neutral. It is important to lift using the strength of your legs, not your back. Do not lock your knees straight out.  Always ask for help to lift heavy or awkward objects.     This information is not intended to replace advice given to you by your health care provider. Make sure you discuss any questions you have with your health care provider. Document Revised: 06/23/2018 Document Reviewed: 03/23/2018 Elsevier Patient Education  2021 [employer] Inc.  

## 2020-04-29 NOTE — Progress Notes (Signed)
Subjective:    Patient ID: David Sheppard, male    DOB: 02-26-1966, 55 y.o.   MRN: [id number]  HPI  Pt presents to the clinic today with c/o right lower back pain. This started 3 days ago. He describes the pain as burning. The pain radiates into his right lower leg. He denies numbness, tingling, weakness of the right lower extremity. He denies loss of bowel or bladder control. He denies any injury to the area. Lumbar xray from 2019 showed:  IMPRESSION: Mild disc space narrowing at L5-S1. No appreciable facetarthropathy. No fracture or spondylolisthesis.  He has tried Tylenol OTC with minimal relief.    Review of Systems      Past Medical History:  Diagnosis Date  . Allergy   . Generalized anxiety disorder 07/30/2011  . GERD (gastroesophageal reflux disease)   . Migraine   . Panic attacks 07/30/2011    Current Outpatient Medications  Medication Sig Dispense Refill  . citalopram (CELEXA) 20 MG tablet TAKE 1 TABLET BY MOUTH EVERY DAY 90 tablet 1  . diclofenac (VOLTAREN) 75 MG EC tablet Take 1 tablet (75 mg total) by mouth 2 (two) times daily. 50 tablet 2  . methylPREDNISolone (MEDROL DOSEPAK) 4 MG TBPK tablet Take by mouth.    . pantoprazole (PROTONIX) 40 MG tablet TAKE 1 TABLET BY MOUTH EVERY DAY 90 tablet 3  . scopolamine (TRANSDERM-SCOP, 1.5 MG,) 1 MG/3DAYS Place 1 patch (1.5 mg total) onto the skin every 3 (three) days. 10 patch 1  . SUMAtriptan (IMITREX) 100 MG tablet TAKE 1 TABLET BY MOUTH AS NEEDED FOR MIGRAINE. MAY REPEAT IN 2 HRS IF NEEDED. MAX 2TABS/DAY 9 tablet 2   No current facility-administered medications for this visit.    No Known Allergies  Family History  Problem Relation Age of Onset  . Hypertension Father   . Heart disease Paternal Grandfather   . Hyperlipidemia Paternal Grandfather   . Diabetes Paternal Grandfather   . Hyperlipidemia Paternal Grandmother   . Diabetes Paternal Grandmother     Social History   Socioeconomic History  . Marital  status: Married    Spouse name: Not on file  . Number of children: 2  . Years of education: Not on file  . Highest education level: Not on file  Occupational History  . Occupation: injection mold setup machinery   Tobacco Use  . Smoking status: Current Every Day Smoker    Packs/day: 0.50    Years: 10.00    Pack years: 5.00    Types: Cigarettes  . Smokeless tobacco: Former Engineer, water and Sexual Activity  . Alcohol use: Yes    Alcohol/week: 0.0 standard drinks    Comment: occ  . Drug use: No  . Sexual activity: Not on file  Other Topics Concern  . Not on file  Social History Narrative   1 daily caffiene use   Regular exercise-walking         Social Determinants of Health   Financial Resource Strain: Not on file  Food Insecurity: Not on file  Transportation Needs: Not on file  Physical Activity: Not on file  Stress: Not on file  Social Connections: Not on file  Intimate Partner Violence: Not on file     Constitutional: Denies fever, malaise, fatigue, headache or abrupt weight changes.  Respiratory: Denies difficulty breathing, shortness of breath, cough or sputum production.   Cardiovascular: Denies chest pain, chest tightness, palpitations or swelling in the hands or feet.  Gastrointestinal: Denies abdominal  pain, bloating, constipation, diarrhea or blood in the stool.  GU: Denies urgency, frequency, pain with urination, burning sensation, blood in urine, odor or discharge. Musculoskeletal: Pt reports right side low back pain. Denies difficulty with gait, or joint swelling.  Skin: Denies redness, rashes, lesions or ulcercations.  Neurological: Denies numbness, tingling, weakness or problems with balance and coordination.    No other specific complaints in a complete review of systems (except as listed in HPI above).  Objective:   Physical Exam BP 124/84   Pulse 74   Temp 97.8 F (36.6 C) (Temporal)   Wt 189 lb (85.7 kg)   SpO2 98%   BMI 29.16 kg/m    Wt Readings from Last 3 Encounters:  02/26/20 185 lb 12 oz (84.3 kg)  11/26/19 181 lb 8 oz (82.3 kg)  09/10/19 179 lb 8 oz (81.4 kg)    General: Appears his stated age, well developed, well nourished in NAD. Skin: Warm, dry and intact. No rashes noted. Cardiovascular: Normal rate. Pulmonary/Chest: Normal effort. Musculoskeletal: Decreased flexion, extension, rotation and lateral bending to the left. Normal rotation and lateral bending to the right. No bony tenderness noted over the spine. Strength 5/5 BLE. Able to stand on tiptoes but not heels. Limping gait. Neurological: Alert and oriented. Positive SLR on the right.  BMET    Component Value Date/Time   NA 139 11/22/2019 1721   NA 143 07/30/2013 0348   K 4.3 11/22/2019 1721   K 3.7 07/30/2013 0348   CL 103 11/22/2019 1721   CL 118 (H) 07/30/2013 0348   CO2 26 11/22/2019 1721   CO2 25 07/30/2013 0348   GLUCOSE 92 11/22/2019 1721   GLUCOSE 104 (H) 07/30/2013 0348   BUN 11 11/22/2019 1721   BUN 8 07/30/2013 0348   CREATININE 1.04 11/22/2019 1721   CREATININE 0.98 10/20/2018 1548   CALCIUM 9.3 11/22/2019 1721   CALCIUM 7.0 (LL) 07/30/2013 0348   GFRNONAA >60 03/18/2018 0524   GFRNONAA >60 07/30/2013 0348   GFRAA >60 03/18/2018 0524   GFRAA >60 07/30/2013 0348    Lipid Panel     Component Value Date/Time   CHOL 202 (H) 11/22/2019 1721   TRIG 195.0 (H) 11/22/2019 1721   HDL 44.90 11/22/2019 1721   CHOLHDL 5 11/22/2019 1721   VLDL 39.0 11/22/2019 1721   LDLCALC 118 (H) 11/22/2019 1721   LDLCALC 159 (H) 10/20/2018 1548    CBC    Component Value Date/Time   WBC 8.4 03/17/2020 1139   WBC 7.0 11/22/2019 1721   RBC 5.24 03/17/2020 1139   RBC 4.60 11/22/2019 1721   HGB 17.3 03/17/2020 1139   HCT 47.6 03/17/2020 1139   PLT 209 03/17/2020 1139   MCV 91 03/17/2020 1139   MCV 94 07/31/2013 0522   MCH 33.0 03/17/2020 1139   MCH 32.6 10/20/2018 1548   MCHC 36.3 (H) 03/17/2020 1139   MCHC 34.4 11/22/2019 1721   RDW  11.4 (L) 03/17/2020 1139   RDW 13.8 07/31/2013 0522   LYMPHSABS 1.9 03/17/2020 1139   LYMPHSABS 1.6 07/31/2013 0522   MONOABS 0.5 11/22/2019 1721   MONOABS 0.5 07/31/2013 0522   EOSABS 0.1 03/17/2020 1139   EOSABS 0.3 07/31/2013 0522   BASOSABS 0.1 03/17/2020 1139   BASOSABS 0.1 07/31/2013 0522    Hgb A1C Lab Results  Component Value Date   HGBA1C 5.3 11/22/2019           Assessment & Plan:   Acute Right Sided Low Back  Pain with Right Side Sciatica:  No indication for repeat imaging at this time RX for Pred Taper x 9 days- avoid NSAID's OTC RX for Flexeril 10 mg TID prn- sedation caution given Encouraged ice for 10 minutes 2 x day Encouraged stretching Consider PT if symptoms persist or worsen  Return precautions discussed Nicki Reaper, NP This visit occurred during the SARS-CoV-2 public health emergency.  Safety protocols were in place, including screening questions prior to the visit, additional usage of staff PPE, and extensive cleaning of exam room while observing appropriate contact time as indicated for disinfecting solutions.

## 2020-04-30 ENCOUNTER — Encounter: Payer: Self-pay | Admitting: Internal Medicine

## 2020-04-30 MED ORDER — ACETAMINOPHEN-CODEINE #3 300-30 MG PO TABS: 1.0000 | ORAL_TABLET | Freq: Three times a day (TID) | ORAL | 0 refills | Status: DC | PRN

## 2020-05-01 NOTE — Telephone Encounter (Signed)
Ok for work note. Yes, can take weeks to improve sometimes.

## 2020-05-02 ENCOUNTER — Other Ambulatory Visit: Payer: Self-pay

## 2020-05-02 ENCOUNTER — Encounter: Payer: Self-pay | Admitting: Family Medicine

## 2020-05-02 ENCOUNTER — Telehealth: Payer: Self-pay

## 2020-05-02 ENCOUNTER — Ambulatory Visit
Admission: EM | Admit: 2020-05-02 | Discharge: 2020-05-02 | Disposition: A | Discharge status: Home / Self Care | Payer: BC Managed Care – PPO | Attending: Emergency Medicine | Admitting: Emergency Medicine

## 2020-05-02 ENCOUNTER — Ambulatory Visit (INDEPENDENT_AMBULATORY_CARE_PROVIDER_SITE_OTHER): Payer: BC Managed Care – PPO

## 2020-05-02 DIAGNOSIS — M5136 Other intervertebral disc degeneration, lumbar region: Secondary | ICD-10-CM | POA: Diagnosis not present

## 2020-05-02 DIAGNOSIS — M545 Low back pain, unspecified: Secondary | ICD-10-CM | POA: Diagnosis not present

## 2020-05-02 DIAGNOSIS — M5431 Sciatica, right side: Secondary | ICD-10-CM | POA: Diagnosis not present

## 2020-05-02 MED ORDER — KETOROLAC TROMETHAMINE 30 MG/ML IJ SOLN
30.0000 mg | Freq: Once | INTRAMUSCULAR | Status: AC
  Administered 2020-05-02: 30 mg via INTRAMUSCULAR

## 2020-05-02 MED ORDER — HYDROCODONE-ACETAMINOPHEN 5-325 MG PO TABS: 1.0000 | ORAL_TABLET | Freq: Four times a day (QID) | ORAL | 0 refills | Status: AC | PRN

## 2020-05-02 NOTE — Telephone Encounter (Signed)
Per end of access nurse note pt is going to go to an UC. Sending note to Pamala Hurry NP.

## 2020-05-02 NOTE — ED Provider Notes (Signed)
David Sheppard    CSN: [id number] Arrival date & time: 05/02/20  1008      History   Chief Complaint Chief Complaint  Patient presents with  . Back Pain  . Leg Pain    right  . Hip Pain    HPI David Sheppard is a 55 y.o. male.   Patient is a 55 year old male with complaint of right lower back pain that radiates down right leg.  Patient was evaluated for same on Tuesday and given prescriptions for muscle relaxer and steroids and told to use ice for pain.  Patient reports that pain initially improved but worsen since yesterday.  Patient states that pain radiates down leg.  Denies any tingling or numbness.  Denies extremity weakness.  No loss of bowel or bladder function.    The history is provided by the patient.  Back Pain Location:  Lumbar spine Quality:  Burning Radiates to:  R posterior upper leg, R thigh and R foot Pain severity:  Severe Pain is:  Same all the time Onset quality:  Sudden Progression:  Waxing and waning Chronicity:  New Context: not emotional stress, not falling, not physical stress and not recent injury   Relieved by:  Being still Worsened by:  Ambulation and movement Ineffective treatments:  Muscle relaxants and cold packs Associated symptoms: leg pain   Associated symptoms: no bladder incontinence, no bowel incontinence, no numbness, no paresthesias and no tingling   Leg Pain Associated symptoms: back pain   Hip Pain    Past Medical History:  Diagnosis Date  . Allergy   . Generalized anxiety disorder 07/30/2011  . GERD (gastroesophageal reflux disease)   . Migraine   . Panic attacks 07/30/2011    Patient Active Problem List   Diagnosis Date Noted  . Pain and swelling of toe of left foot 02/26/2020  . Testicle tenderness 05/28/2018  . Gallstone pancreatitis   . Increased liver enzymes   . Generalized anxiety disorder 07/30/2011  . Panic attacks 07/30/2011  . GERD 06/21/2008  . MIGRAINE HEADACHE 06/04/2008  . ALLERGIC  RHINITIS 06/04/2008    Past Surgical History:  Procedure Laterality Date  . CHOLECYSTECTOMY N/A 03/17/2018   Procedure: LAPAROSCOPIC CHOLECYSTECTOMY;  Surgeon: Duanne Guess, MD;  Location: [facility] ORS;  Service: General;  Laterality: N/A;  . HERNIA REPAIR    . TONSILLECTOMY AND ADENOIDECTOMY         Home Medications    Prior to Admission medications   Medication Sig Start Date End Date Taking? Authorizing Provider  cyclobenzaprine (FLEXERIL) 10 MG tablet Take 1 tablet (10 mg total) by mouth 3 (three) times daily as needed for muscle spasms. 04/29/20  Yes Lorre Munroe, NP  HYDROcodone-acetaminophen (NORCO/VICODIN) 5-325 MG tablet Take 1-2 tablets by mouth every 6 (six) hours as needed for up to 3 days. 05/02/20 05/05/20 Yes Ivette Loyal, NP  citalopram (CELEXA) 20 MG tablet TAKE 1 TABLET BY MOUTH EVERY DAY 01/14/20   Copland, Karleen Hampshire, MD  pantoprazole (PROTONIX) 40 MG tablet TAKE 1 TABLET BY MOUTH EVERY DAY 01/14/20   Copland, Karleen Hampshire, MD  predniSONE (DELTASONE) 10 MG tablet Take 3 tabs on days 1-3, take 2 tabs on days 4-6, take 1 tab on days 7-9 04/29/20   Lorre Munroe, NP  scopolamine (TRANSDERM-SCOP, 1.5 MG,) 1 MG/3DAYS Place 1 patch (1.5 mg total) onto the skin every 3 (three) days. 11/29/19   Copland, Karleen Hampshire, MD  SUMAtriptan (IMITREX) 100 MG tablet TAKE 1 TABLET BY  MOUTH AS NEEDED FOR MIGRAINE. MAY REPEAT IN 2 HRS IF NEEDED. MAX 2TABS/DAY 04/20/20   Hannah Beat, MD    Family History Family History  Problem Relation Age of Onset  . Hypertension Father   . Heart disease Paternal Grandfather   . Hyperlipidemia Paternal Grandfather   . Diabetes Paternal Grandfather   . Hyperlipidemia Paternal Grandmother   . Diabetes Paternal Grandmother     Social History Social History   Tobacco Use  . Smoking status: Current Every Day Smoker    Packs/day: 0.50    Years: 10.00    Pack years: 5.00    Types: Cigarettes  . Smokeless tobacco: Former Engineer, water Use Topics  .  Alcohol use: Yes    Alcohol/week: 0.0 standard drinks    Comment: occ  . Drug use: No     Allergies   Patient has no known allergies.   Review of Systems Review of Systems  Gastrointestinal: Negative for bowel incontinence.  Genitourinary: Negative for bladder incontinence.  Musculoskeletal: Positive for back pain.  Neurological: Negative for tingling, numbness and paresthesias.     Physical Exam Triage Vital Signs ED Triage Vitals  Enc Vitals Group     BP 05/02/20 1013 (!) 158/96     Pulse Rate 05/02/20 1013 75     Resp 05/02/20 1013 18     Temp 05/02/20 1013 97.6 F (36.4 C)     Temp Source 05/02/20 1013 Oral     SpO2 05/02/20 1013 98 %     Weight --      Height --      Head Circumference --      Peak Flow --      Pain Score 05/02/20 1020 10     Pain Loc --      Pain Edu? --      Excl. in GC? --    No data found.  Updated Vital Signs BP (!) 158/96 (BP Location: Left Arm)   Pulse 75   Temp 97.6 F (36.4 C) (Oral)   Resp 18   SpO2 98%   Visual Acuity Right Eye Distance:   Left Eye Distance:   Bilateral Distance:    Right Eye Near:   Left Eye Near:    Bilateral Near:     Physical Exam Constitutional:      General: He is not in acute distress.    Appearance: He is not ill-appearing or diaphoretic.  HENT:     Head: Normocephalic and atraumatic.  Eyes:     Conjunctiva/sclera: Conjunctivae normal.  Pulmonary:     Effort: Pulmonary effort is normal.  Musculoskeletal:     Cervical back: Normal and normal range of motion.     Thoracic back: Normal.     Lumbar back: Tenderness present. No bony tenderness. Normal range of motion. Positive right straight leg raise test.     Comments: Tenderness in right sciatic notch with pain radiating down leg  Neurological:     Mental Status: He is alert.     Motor: Motor function is intact.     Gait: Gait is intact.      UC Treatments / Results  Labs (all labs ordered are listed, but only abnormal results  are displayed) Labs Reviewed - No data to display  EKG   Radiology DG Lumbar Spine Complete  Result Date: 05/02/2020 CLINICAL DATA:  Low back pain. EXAM: LUMBAR SPINE - COMPLETE 4+ VIEW COMPARISON:  12/01/2017 FINDINGS: Normal alignment of the lumbar  spine. The vertebral body heights are well preserved. Mild multilevel disc space narrowing and ventral endplate spurring noted compatible with degenerative disc disease. IMPRESSION: Mild degenerative disc disease. Electronically Signed   By: Signa Kell M.D.   On: 05/02/2020 10:51    Procedures Procedures (including critical care time)  Medications Ordered in UC Medications  ketorolac (TORADOL) 30 MG/ML injection 30 mg (has no administration in time range)    Initial Impression / Assessment and Plan / UC Course  I have reviewed the triage vital signs and the nursing notes.  Pertinent labs & imaging results that were available during my care of the patient were reviewed by me and considered in my medical decision making (see chart for details).     Sciatic nerve pain.   Xray similar to previous with mild degenerative disc disease, no acute findings. Assessment negative for red flags.   Toradol IM given in office.  Discontinued prescription for tylenol-3 and given Norco for pain relief.  May use heat and ice.  Encouraged to perform exercises previously given to him.   Final Clinical Impressions(s) / UC Diagnoses   Final diagnoses:  Sciatic nerve pain, right     Discharge Instructions     You have some sciatic nerve pain.   You may take the Norco 1-2 pills every 6 hours as needed.  DO NOT TAKE ANY MORE OF THE TYLENOL-3  Alternate heat and ice.    Follow up if symptoms do not improve or worsen over the next few days.     ED Prescriptions    Medication Sig Dispense Auth. Provider   HYDROcodone-acetaminophen (NORCO/VICODIN) 5-325 MG tablet Take 1-2 tablets by mouth every 6 (six) hours as needed for up to 3 days. 12 tablet  Ivette Loyal, NP     I have reviewed the PDMP during this encounter.   Ivette Loyal, NP 05/02/20 1120

## 2020-05-02 NOTE — Telephone Encounter (Signed)
noted 

## 2020-05-02 NOTE — ED Triage Notes (Signed)
Pt presents today with right hip pain radiating down right leg x 6 days. Was seen by PCP on Tuesday, given muscle relaxers and steroids with no relief. Now taking Tylenol with codeine with no relief.

## 2020-05-02 NOTE — Discharge Instructions (Signed)
You have some sciatic nerve pain.   You may take the Norco 1-2 pills every 6 hours as needed.  DO NOT TAKE ANY MORE OF THE TYLENOL-3  Alternate heat and ice.    Follow up if symptoms do not improve or worsen over the next few days.

## 2020-05-02 NOTE — Telephone Encounter (Signed)
[facility] Day - Client TELEPHONE ADVICE RECORD [facility] Patient Name: David Sheppard Gender: Male DOB: 05-Apr-1965 Age: 55 Y 7 M 26 D Return Phone Number: [phone] (Primary) Address: City/State/Zip: [place] [address] Client [facility] Day - Client Client Site [facility] - Day Physician Copland, Karleen Hampshire - MD Contact Type Call Who Is Calling Patient / Member / Family / Caregiver Call Type Triage / Clinical Relationship To Patient Self Return Phone Number [phone] (Primary) Chief Complaint Leg Pain Reason for Call Symptomatic / Request for Health Information Initial Comment Caller states that his sciatica nerve is getting worse. Translation No Nurse Assessment Nurse: Stefano Gaul, RN, Dwana Curd Date/Time (Eastern Time): 05/02/2020 8:18:05 AM Confirm and document reason for call. If symptomatic, describe symptoms. ---Caller states his sciatica is getting worse. Saw doctor on Tuesday who gave him steroids and tylenol with codiene. pain on right side and is radiating down his right leg. pain level 10. Does the patient have any new or worsening symptoms? ---Yes Will a triage be completed? ---Yes Related visit to physician within the last 2 weeks? ---Yes Does the PT have any chronic conditions? (i.e. diabetes, asthma, this includes High risk factors for pregnancy, etc.) ---Yes List chronic conditions. ---sciatica Is this a behavioral health or substance abuse call? ---No Guidelines Guideline Title Affirmed Question Affirmed Notes Nurse Date/Time (Eastern Time) Back Pain [1] SEVERE back pain (e.g., excruciating, unable to do any normal activities) AND [2] not improved 2 hours after pain medicine Stefano Gaul, RN, Dwana Curd 05/02/2020 8:20:31 AM Disp. Time Lamount Cohen Time) Disposition Final User 05/02/2020 8:28:05 AM See HCP within 4 Hours (or PCP triage) Yes Stefano Gaul, RN, Dwana Curd PLEASE NOTE: All timestamps contained within  this report are represented as [place] Standard Time. CONFIDENTIALTY NOTICE: This fax transmission is intended only for the addressee. It contains information that is legally privileged, confidential or otherwise protected from use or disclosure. If you are not the intended recipient, you are strictly prohibited from reviewing, disclosing, copying using or disseminating any of this information or taking any action in reliance on or regarding this information. If you have received this fax in error, please notify us immediately by telephone so that we can arrange for its return to [place]. Phone: [phone], Toll-Free: [phone], Fax: [phone] Page: 2 of 2 Call Id: [id number] Caller Disagree/Comply Comply Caller Understands Yes PreDisposition Call Doctor Care Advice Given Per Guideline SEE HCP (OR PCP TRIAGE) WITHIN 4 HOURS: * IF OFFICE WILL BE OPEN: You need to be seen within the next 3 or 4 hours. Call your doctor (or NP/PA) now or as soon as the office opens. CALL BACK IF: * You become worse CARE ADVICE given per Back Pain (Adult) guideline. Comments User: Art Buff, RN Date/Time Lamount Cohen Time): 05/02/2020 8:27:40 AM Called back line and spoke to [place] and gave report that pt has severe back pain. triage outcome see physician within 4 hrs. No appts available today. Advised pt to go to urgent care. States he will. Referrals GO TO FACILITY UNDECIDED

## 2020-05-05 MED ORDER — PREDNISONE 20 MG PO TABS: ORAL_TABLET | ORAL | 0 refills | Status: DC

## 2020-05-05 NOTE — Telephone Encounter (Signed)
Trey Paula scheduled an appointment with Dr. Patsy Lager for 05/07/2020 at 10:40 am.

## 2020-05-05 NOTE — Telephone Encounter (Signed)
I will send your note to the treating provider for any paperwork for your workplace.   There is basically no chance that one of the spine surgeons would see you after such a short amount of time with your symptoms.  The majority of my practice is non-operative orthopaedics and sports medicine.  If you would like to see a different doctor, I can ask, but they may refuse the consult.  I sent you in a 14 day course of prednisone.  I would normally have people follow-up with me in 2-3 weeks.  Cc: Mrs. Sampson Si

## 2020-05-05 NOTE — Telephone Encounter (Signed)
Can you call him  I need to evaluate in the office.  This cannot be addressed over a video.  There are very few opioids stronger than hydrocodone.  I am not comfortable prescribing anything else in general and never without evaluating you.  I can call in a few more hydrocodone if he is out.

## 2020-05-06 DIAGNOSIS — M5459 Other low back pain: Secondary | ICD-10-CM | POA: Diagnosis not present

## 2020-05-06 DIAGNOSIS — M5416 Radiculopathy, lumbar region: Secondary | ICD-10-CM | POA: Diagnosis not present

## 2020-05-07 ENCOUNTER — Ambulatory Visit: Payer: BC Managed Care – PPO | Admitting: Family Medicine

## 2020-05-07 DIAGNOSIS — Z0289 Encounter for other administrative examinations: Secondary | ICD-10-CM

## 2020-05-07 NOTE — Progress Notes (Deleted)
    David T. Copland, MD, CAQ Sports Medicine  Primary Care and Sports Medicine [facility] [address] [place], [address]  Phone: [phone]  FAX: [phone]  BELL CAI - 55 y.o. male  MRN [id number]  Date of Birth: Dec 01, 1965  Date: 05/07/2020  PCP: Hannah Beat, MD  Referral: Hannah Beat, MD  No chief complaint on file.   This visit occurred during the SARS-CoV-2 public health emergency.  Safety protocols were in place, including screening questions prior to the visit, additional usage of staff PPE, and extensive cleaning of exam room while observing appropriate contact time as indicated for disinfecting solutions.   Subjective:   David Sheppard is a 55 y.o.  very pleasant male patient with There is no height or weight on file to calculate BMI. who presents with the following:  He is here in follow-up regarding low back pain with radicular symptoms.  At this point, his symptoms are on the right side.  I have seen him before for left-sided lumbar radiculopathy.  He initially saw Mrs. Baity on April 29, 2020, and subsequently additionally on May 02, 2020 the patient was seen at [facility] urgent care.  He was given a 9-day taper for some prednisone as well as a prescription for Flexeril.  He called into our office/sent an email regarding some ongoing pain as well as potential short-term to disability:  He is here to follow-up today in regards to these issues.    Review of Systems is noted in the HPI, as appropriate   Objective:   There were no vitals taken for this visit.  ***  Radiology: DG Lumbar Spine Complete  Result Date: 05/02/2020 CLINICAL DATA:  Low back pain. EXAM: LUMBAR SPINE - COMPLETE 4+ VIEW COMPARISON:  12/01/2017 FINDINGS: Normal alignment of the lumbar spine. The vertebral body heights are well preserved. Mild multilevel disc space narrowing and ventral endplate spurring noted  compatible with degenerative disc disease. IMPRESSION: Mild degenerative disc disease. Electronically Signed   By: Signa Kell M.D.   On: 05/02/2020 10:51    Assessment and Plan:   ***

## 2020-05-09 DIAGNOSIS — M5416 Radiculopathy, lumbar region: Secondary | ICD-10-CM | POA: Diagnosis not present

## 2020-05-19 DIAGNOSIS — M545 Low back pain, unspecified: Secondary | ICD-10-CM | POA: Diagnosis not present

## 2020-05-22 DIAGNOSIS — M5416 Radiculopathy, lumbar region: Secondary | ICD-10-CM | POA: Diagnosis not present

## 2020-05-26 DIAGNOSIS — M545 Low back pain, unspecified: Secondary | ICD-10-CM | POA: Diagnosis not present

## 2020-05-28 DIAGNOSIS — M545 Low back pain, unspecified: Secondary | ICD-10-CM | POA: Diagnosis not present

## 2020-06-03 DIAGNOSIS — M545 Low back pain, unspecified: Secondary | ICD-10-CM | POA: Diagnosis not present

## 2020-06-05 DIAGNOSIS — M5136 Other intervertebral disc degeneration, lumbar region: Secondary | ICD-10-CM | POA: Diagnosis not present

## 2020-06-23 ENCOUNTER — Telehealth: Payer: Self-pay | Admitting: Family Medicine

## 2020-06-23 NOTE — Telephone Encounter (Signed)
Patient called in about a 50.00 no show fee from 05/07/20 He is wanting to dispute the charge. Please advise patient EM

## 2020-06-25 NOTE — Telephone Encounter (Signed)
After investigation, I find it appropriate to waive this fee. Patient does not no-show appointments habitually and it looks like he called a few hours prior to the appointment to cancel. I sent this over to charge correction and it has been taken off. I called and let patient know. He verbalized understanding and thanked me for calling him back.

## 2020-08-24 ENCOUNTER — Other Ambulatory Visit: Payer: Self-pay | Admitting: Family Medicine

## 2020-12-07 ENCOUNTER — Other Ambulatory Visit: Payer: Self-pay | Admitting: Family Medicine

## 2020-12-07 NOTE — Telephone Encounter (Signed)
Please scheduled CPE with Dr. Patsy Lager.

## 2020-12-08 ENCOUNTER — Encounter: Payer: Self-pay | Admitting: Family Medicine

## 2020-12-08 NOTE — Telephone Encounter (Signed)
lvmtcb

## 2020-12-08 NOTE — Telephone Encounter (Signed)
SENT MY CHART MESSAGE

## 2020-12-19 ENCOUNTER — Encounter: Payer: Self-pay | Admitting: General Surgery

## 2021-01-01 ENCOUNTER — Telehealth: Payer: Self-pay

## 2021-01-01 ENCOUNTER — Ambulatory Visit (INDEPENDENT_AMBULATORY_CARE_PROVIDER_SITE_OTHER): Payer: BC Managed Care – PPO | Admitting: Family Medicine

## 2021-01-01 ENCOUNTER — Other Ambulatory Visit: Payer: Self-pay

## 2021-01-01 ENCOUNTER — Encounter: Payer: Self-pay | Admitting: Family Medicine

## 2021-01-01 VITALS — BP 110/70 | HR 90 | Temp 98.0°F | Ht 67.5 in | Wt 184.5 lb

## 2021-01-01 DIAGNOSIS — Z131 Encounter for screening for diabetes mellitus: Secondary | ICD-10-CM

## 2021-01-01 DIAGNOSIS — Z125 Encounter for screening for malignant neoplasm of prostate: Secondary | ICD-10-CM | POA: Diagnosis not present

## 2021-01-01 DIAGNOSIS — Z13220 Encounter for screening for lipoid disorders: Secondary | ICD-10-CM

## 2021-01-01 DIAGNOSIS — Z1211 Encounter for screening for malignant neoplasm of colon: Secondary | ICD-10-CM | POA: Diagnosis not present

## 2021-01-01 DIAGNOSIS — Z0000 Encounter for general adult medical examination without abnormal findings: Secondary | ICD-10-CM

## 2021-01-01 DIAGNOSIS — R5383 Other fatigue: Secondary | ICD-10-CM

## 2021-01-01 DIAGNOSIS — Z1159 Encounter for screening for other viral diseases: Secondary | ICD-10-CM

## 2021-01-01 NOTE — Progress Notes (Signed)
David Puryear T. Mrytle Bento, MD, [place] at [facility] [place], [address]  Phone: [phone]  FAX: [phone]  David Sheppard - 55 y.o. male  MRN [id number]  Date of Birth: [birth year]  Date: 01/01/2021  PCP: Owens Loffler, MD  Referral: Owens Loffler, MD  Chief Complaint  Patient presents with   Annual Exam    This visit occurred during the SARS-CoV-2 public health emergency.  Safety protocols were in place, including screening questions prior to the visit, additional usage of staff PPE, and extensive cleaning of exam room while observing appropriate contact time as indicated for disinfecting solutions.   Patient Care Team: Owens Loffler, MD as PCP - General (Family Medicine) Subjective:   David Sheppard is a 55 y.o. pleasant patient who presents with the following:  Preventative Health Maintenance Visit:  Globally he is doing well.  Health Maintenance Summary Reviewed and updated, unless pt declines services.  Tobacco History Reviewed. 1 pack a day Alcohol: No concerns, no excessive use, weekend only Exercise Habits: 10, - 11,000 steps a day STD concerns: no risk or activity to increase risk Drug Use: None  HEP c Colonoscopy - ordered  Shingrix Flu -  Covid -  Declines all  Health Maintenance  Topic Date Due   COVID-19 Vaccine (1) Never done   Pneumococcal Vaccine 42-82 Years old (1 - PCV) Never done   Hepatitis C Screening  Never done   Zoster Vaccines- Shingrix (1 of 2) Never done   COLONOSCOPY (Pts 45-49yr Insurance coverage will need to be confirmed)  Never done   INFLUENZA VACCINE  06/12/2021 (Originally 10/13/2020)   TETANUS/TDAP  10/24/2028   HIV Screening  Completed   HPV VACCINES  Aged Out   Immunization History  Administered Date(s) Administered   Td 06/03/2008   Tdap 10/25/2018   Patient Active Problem List   Diagnosis Date Noted   Gallstone pancreatitis     Generalized anxiety disorder 07/30/2011   Panic attacks 07/30/2011   GERD 06/21/2008   MIGRAINE HEADACHE 06/04/2008   ALLERGIC RHINITIS 06/04/2008    Past Medical History:  Diagnosis Date   Allergy    Generalized anxiety disorder 07/30/2011   GERD (gastroesophageal reflux disease)    Migraine    Panic attacks 07/30/2011    Past Surgical History:  Procedure Laterality Date   CHOLECYSTECTOMY N/A 03/17/2018   Procedure: LAPAROSCOPIC CHOLECYSTECTOMY;  Surgeon: CFredirick Maudlin MD;  Location: [facility] ORS;  Service: General;  Laterality: N/A;   HERNIA REPAIR     TONSILLECTOMY AND ADENOIDECTOMY      Family History  Problem Relation Age of Onset   Hypertension Father    Heart disease Paternal Grandfather    Hyperlipidemia Paternal Grandfather    Diabetes Paternal Grandfather    Hyperlipidemia Paternal Grandmother    Diabetes Paternal Grandmother     Past Medical History, Surgical History, Social History, Family History, Problem List, Medications, and Allergies have been reviewed and updated if relevant.  Review of Systems: Pertinent positives are listed above.  Otherwise, a full 14 point review of systems has been done in full and it is negative except where it is noted positive.  Objective:   BP 110/70   Pulse 90   Temp 98 F (36.7 C) (Temporal)   Ht 5' 7.5" (1.715 m)   Wt 184 lb 8 oz (83.7 kg)   SpO2 96%   BMI 28.47 kg/m  Ideal Body Weight: Weight in (lb) to  have BMI = 25: 161.7  Ideal Body Weight: Weight in (lb) to have BMI = 25: 161.7 No results found. Depression screen [facility] 2/9 01/01/2021 11/26/2019 10/25/2018 08/04/2017  Decreased Interest 0 0 0 0  Down, Depressed, Hopeless 0 0 0 0  PHQ - 2 Score 0 0 0 0     GEN: well developed, well nourished, no acute distress Eyes: conjunctiva and lids normal, PERRLA, EOMI ENT: TM clear, nares clear, oral exam WNL Neck: supple, no lymphadenopathy, no thyromegaly, no JVD Pulm: clear to auscultation and percussion, respiratory  effort normal CV: regular rate and rhythm, S1-S2, no murmur, rub or gallop, no bruits, peripheral pulses normal and symmetric, no cyanosis, clubbing, edema or varicosities GI: soft, non-tender; no hepatosplenomegaly, masses; active bowel sounds all quadrants GU: deferred Lymph: no cervical, axillary or inguinal adenopathy MSK: gait normal, muscle tone and strength WNL, no joint swelling, effusions, discoloration, crepitus  SKIN: clear, good turgor, color WNL, no rashes, lesions, or ulcerations Neuro: normal mental status, normal strength, sensation, and motion Psych: alert; oriented to person, place and time, normally interactive and not anxious or depressed in appearance.  All labs reviewed with patient. Lab Review:  CBC EXTENDED Latest Ref Rng & Units 03/17/2020 11/22/2019 10/20/2018  WBC 3.4 - 10.8 x10E3/uL 8.4 7.0 5.9  RBC 4.14 - 5.80 x10E6/uL 5.24 4.60 4.85  HGB 13.0 - 17.7 g/dL 17.3 15.1 15.8  HCT 37.5 - 51.0 % 47.6 44.0 44.5  PLT 150 - 450 x10E3/uL 209 175.0 205  NEUTROABS 1.4 - 7.0 x10E3/uL 5.7 3.7 3,174  LYMPHSABS 0.7 - 3.1 x10E3/uL 1.9 2.4 1,923    BMP Latest Ref Rng & Units 11/22/2019 10/20/2018 03/27/2018  Glucose 70 - 99 mg/dL 92 86 93  BUN 6 - 23 mg/dL 11 14 9   Creatinine 0.40 - 1.50 mg/dL 1.04 0.98 0.92  BUN/Creat Ratio 6 - 22 (calc) - NOT APPLICABLE -  Sodium 334 - 145 mEq/L 139 141 138  Potassium 3.5 - 5.1 mEq/L 4.3 4.6 4.4  Chloride 96 - 112 mEq/L 103 103 104  CO2 19 - 32 mEq/L 26 28 27   Calcium 8.4 - 10.5 mg/dL 9.3 10.0 9.3    Hepatic Function Latest Ref Rng & Units 11/22/2019 10/20/2018 03/27/2018  Total Protein 6.0 - 8.3 g/dL 6.8 7.0 7.2  Albumin 3.5 - 5.2 g/dL 4.3 - 3.9  AST 0 - 37 U/L 31 28 29   ALT 0 - 53 U/L 48 52(H) 57(H)  Alk Phosphatase 39 - 117 U/L 96 - 162(H)  Total Bilirubin 0.2 - 1.2 mg/dL 0.3 0.5 0.5  Bilirubin, Direct 0.0 - 0.3 mg/dL 0.1 0.1 0.3    Lab Results  Component Value Date   CHOL 202 (H) 11/22/2019   Lab Results  Component Value Date   HDL  44.90 11/22/2019   Lab Results  Component Value Date   LDLCALC 118 (H) 11/22/2019   Lab Results  Component Value Date   TRIG 195.0 (H) 11/22/2019   Lab Results  Component Value Date   CHOLHDL 5 11/22/2019   No results for input(s): PSA in the last 72 hours. No results found for: HCVAB No results found for: VD25OH   Lab Results  Component Value Date   HGBA1C 5.3 11/22/2019   HGBA1C 5.0 10/20/2018   Lab Results  Component Value Date   LDLCALC 118 (H) 11/22/2019   CREATININE 1.04 11/22/2019     Assessment and Plan:     ICD-10-CM   1. Healthcare maintenance  Z00.00  2. Screen for colon cancer  Z12.11 Ambulatory referral to Gastroenterology    3. Need for hepatitis C screening test  Z11.59 Hepatitis C antibody    4. Screening PSA (prostate specific antigen)  Z12.5 PSA, Total with Reflex to PSA, Free    5. Screening, lipid  Z13.220 Lipid panel    6. Screening for diabetes mellitus  X27.1 Basic metabolic panel    Hemoglobin A1c    7. Other fatigue  R53.83 CBC with Differential/Platelet    Hepatic function panel     Globally, doing well. Primary points - work on smoking, weight, eating healthy, exercise as able  He declined all vaccines today.  He understands that there is some infectious risk to not vaccinate.   Health Maintenance Exam: The patient's preventative maintenance and recommended screening tests for an annual wellness exam were reviewed in full today. Brought up to date unless services declined.  Counselled on the importance of diet, exercise, and its role in overall health and mortality. The patient's FH and SH was reviewed, including their home life, tobacco status, and drug and alcohol status.  Follow-up in 1 year for physical exam or additional follow-up below.  Follow-up: No follow-ups on file. Or follow-up in 1 year if not noted.  No orders of the defined types were placed in this encounter.  Medications Discontinued During This  Encounter  Medication Reason   predniSONE (DELTASONE) 20 MG tablet Completed Course   cyclobenzaprine (FLEXERIL) 10 MG tablet Completed Course   scopolamine (TRANSDERM-SCOP, 1.5 MG,) 1 MG/3DAYS Completed Course   Orders Placed This Encounter  Procedures   Basic metabolic panel   CBC with Differential/Platelet   Hepatic function panel   Lipid panel   PSA, Total with Reflex to PSA, Free   Hepatitis C antibody   Hemoglobin A1c   Ambulatory referral to Gastroenterology     Signed,  Frederico Hamman T. Katheleen Stella, MD   Allergies as of 01/01/2021   No Known Allergies      Medication List        Accurate as of January 01, 2021 11:59 PM. If you have any questions, ask your nurse or doctor.          STOP taking these medications    cyclobenzaprine 10 MG tablet Commonly known as: FLEXERIL Stopped by: Owens Loffler, MD   predniSONE 20 MG tablet Commonly known as: DELTASONE Stopped by: Owens Loffler, MD   scopolamine 1 MG/3DAYS Commonly known as: Transderm-Scop (1.5 MG) Stopped by: Owens Loffler, MD       TAKE these medications    citalopram 20 MG tablet Commonly known as: CELEXA TAKE 1 TABLET BY MOUTH EVERY DAY   pantoprazole 40 MG tablet Commonly known as: PROTONIX TAKE 1 TABLET BY MOUTH EVERY DAY   SUMAtriptan 100 MG tablet Commonly known as: IMITREX TAKE 1 TABLET BY MOUTH AS NEEDED FOR MIGRAINE. MAY REPEAT IN 2 HRS IF NEEDED. MAX 2TABS/DAY

## 2021-01-01 NOTE — Telephone Encounter (Signed)
Pt notified appt 01/01/21 at 2:20 with Dr Patsy Lager at [facility]. Pt voiced understanding and nothing further needed.

## 2021-01-01 NOTE — Telephone Encounter (Signed)
[facility] Night - Client Nonclinical Telephone Record  [facility] Client [facility] Night - Client Client Site [facility] - Night Physician Hannah Beat - MD Contact Type Call Who Is Calling Patient / Member / Family / Caregiver Caller Name David Sheppard Caller Phone Number [phone] Patient Name David Sheppard Patient DOB 1966-01-16 Call Type Message Only Information Provided Reason for Call Request for General Office Information Initial Comment Caller states he has a physical exam appointment tomorrow morning with dr and would like to confirm time. Additional Comment Provided office hours. Disp. Time Disposition Final User 12/31/2020 5:13:02 PM General Information Provided Yes Lorraine Lax Call Closed By: Lorraine Lax Transaction Date/Time: 12/31/2020 5:09:49 PM (ET

## 2021-01-02 ENCOUNTER — Other Ambulatory Visit: Payer: Self-pay | Admitting: Family Medicine

## 2021-01-02 LAB — LIPID PANEL
Cholesterol: 192 mg/dL (ref 0–200)
HDL: 45.3 mg/dL (ref >39.00)
LDL Cholesterol: 116 mg/dL — ABNORMAL HIGH (ref 0–99)
NonHDL: 147.05
Total CHOL/HDL Ratio: 4
Triglycerides: 153 mg/dL — ABNORMAL HIGH (ref 0.0–149.0)
VLDL: 30.6 mg/dL (ref 0.0–40.0)

## 2021-01-02 LAB — HEPATIC FUNCTION PANEL
ALT: 60 U/L — ABNORMAL HIGH (ref 0–53)
AST: 46 U/L — ABNORMAL HIGH (ref 0–37)
Albumin: 4.2 g/dL (ref 3.5–5.2)
Alkaline Phosphatase: 91 U/L (ref 39–117)
Bilirubin, Direct: 0.1 mg/dL (ref 0.0–0.3)
Total Bilirubin: 0.4 mg/dL (ref 0.2–1.2)
Total Protein: 6.7 g/dL (ref 6.0–8.3)

## 2021-01-02 LAB — HEPATITIS C ANTIBODY
Hepatitis C Ab: NONREACTIVE
SIGNAL TO CUT-OFF: 0.05 (ref <1.00)

## 2021-01-02 LAB — CBC WITH DIFFERENTIAL/PLATELET
Basophils Absolute: 0 10*3/uL (ref 0.0–0.1)
Basophils Relative: 0.4 % (ref 0.0–3.0)
Eosinophils Absolute: 0.4 10*3/uL (ref 0.0–0.7)
Eosinophils Relative: 5.2 % — ABNORMAL HIGH (ref 0.0–5.0)
HCT: 44.4 % (ref 39.0–52.0)
Hemoglobin: 15.2 g/dL (ref 13.0–17.0)
Lymphocytes Relative: 26.7 % (ref 12.0–46.0)
Lymphs Abs: 1.9 10*3/uL (ref 0.7–4.0)
MCHC: 34.3 g/dL (ref 30.0–36.0)
MCV: 94.8 fl (ref 78.0–100.0)
Monocytes Absolute: 0.5 10*3/uL (ref 0.1–1.0)
Monocytes Relative: 6.6 % (ref 3.0–12.0)
Neutro Abs: 4.4 10*3/uL (ref 1.4–7.7)
Neutrophils Relative %: 61.1 % (ref 43.0–77.0)
Platelets: 171 10*3/uL (ref 150.0–400.0)
RBC: 4.69 Mil/uL (ref 4.22–5.81)
RDW: 12.6 % (ref 11.5–15.5)
WBC: 7.2 10*3/uL (ref 4.0–10.5)

## 2021-01-02 LAB — BASIC METABOLIC PANEL
BUN: 11 mg/dL (ref 6–23)
CO2: 27 mEq/L (ref 19–32)
Calcium: 9.2 mg/dL (ref 8.4–10.5)
Chloride: 104 mEq/L (ref 96–112)
Creatinine, Ser: 0.85 mg/dL (ref 0.40–1.50)
GFR: 99.33 mL/min (ref >60.00)
Glucose, Bld: 96 mg/dL (ref 70–99)
Potassium: 4.3 mEq/L (ref 3.5–5.1)
Sodium: 139 mEq/L (ref 135–145)

## 2021-01-02 LAB — PSA, TOTAL WITH REFLEX TO PSA, FREE: PSA, Total: 0.3 ng/mL (ref <=4.0)

## 2021-01-02 LAB — HEMOGLOBIN A1C: Hgb A1c MFr Bld: 5.3 % (ref 4.6–6.5)

## 2021-01-14 ENCOUNTER — Other Ambulatory Visit: Payer: Self-pay | Admitting: Family Medicine

## 2021-01-14 DIAGNOSIS — R748 Abnormal levels of other serum enzymes: Secondary | ICD-10-CM

## 2021-02-10 ENCOUNTER — Other Ambulatory Visit (INDEPENDENT_AMBULATORY_CARE_PROVIDER_SITE_OTHER): Payer: BC Managed Care – PPO

## 2021-02-10 ENCOUNTER — Other Ambulatory Visit: Payer: Self-pay

## 2021-02-10 DIAGNOSIS — R748 Abnormal levels of other serum enzymes: Secondary | ICD-10-CM | POA: Diagnosis not present

## 2021-02-11 LAB — HEPATIC FUNCTION PANEL
ALT: 40 U/L (ref 0–53)
AST: 24 U/L (ref 0–37)
Albumin: 4.3 g/dL (ref 3.5–5.2)
Alkaline Phosphatase: 75 U/L (ref 39–117)
Bilirubin, Direct: 0.1 mg/dL (ref 0.0–0.3)
Total Bilirubin: 0.4 mg/dL (ref 0.2–1.2)
Total Protein: 7.1 g/dL (ref 6.0–8.3)

## 2021-03-01 IMAGING — DX DG FOOT COMPLETE 3+V*L*
3 series · 3 of 3 positions shown · non-contrast
Comparison: None.

CLINICAL DATA: Trauma 1 week ago

EXAM:
LEFT FOOT - COMPLETE 3+ VIEW

[foot ap]
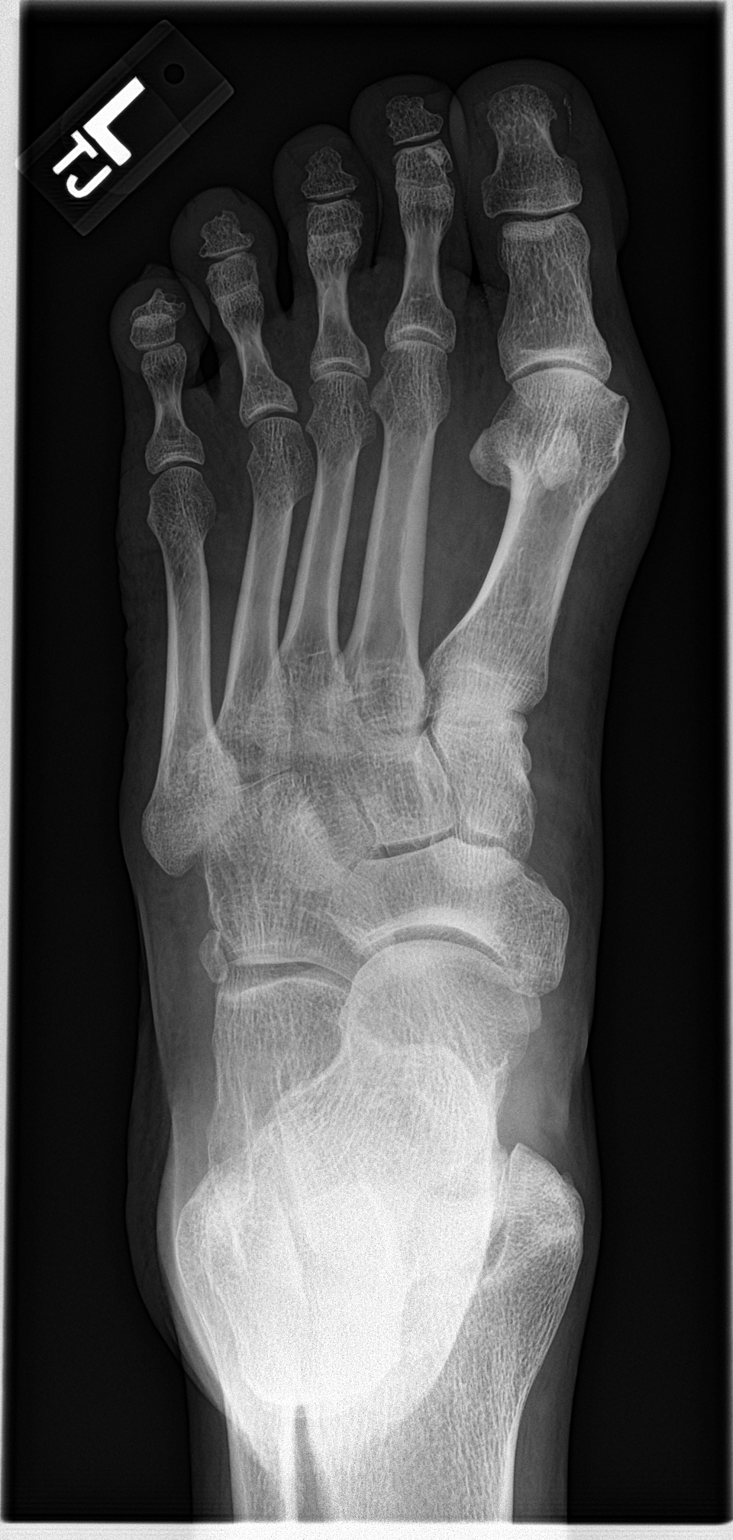

[foot obl]
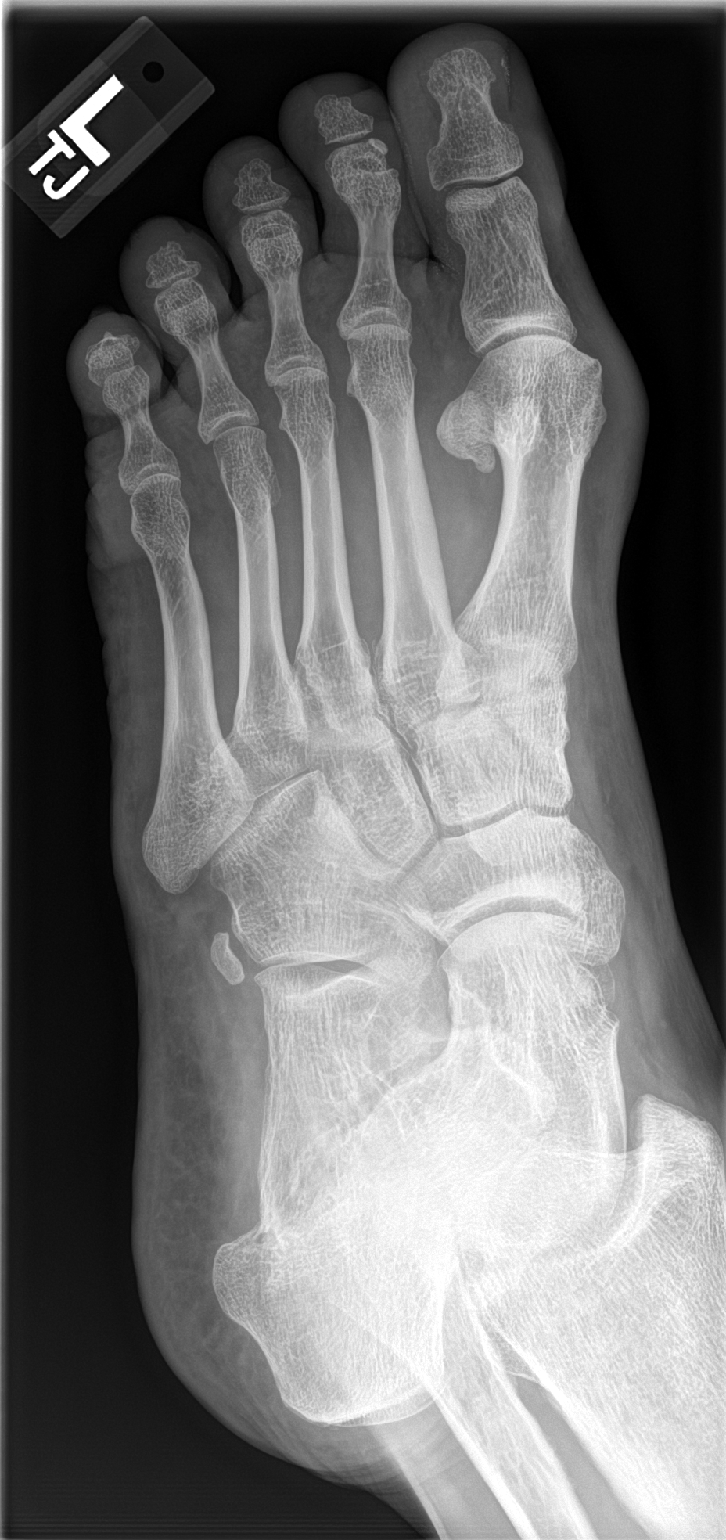

[foot lat]
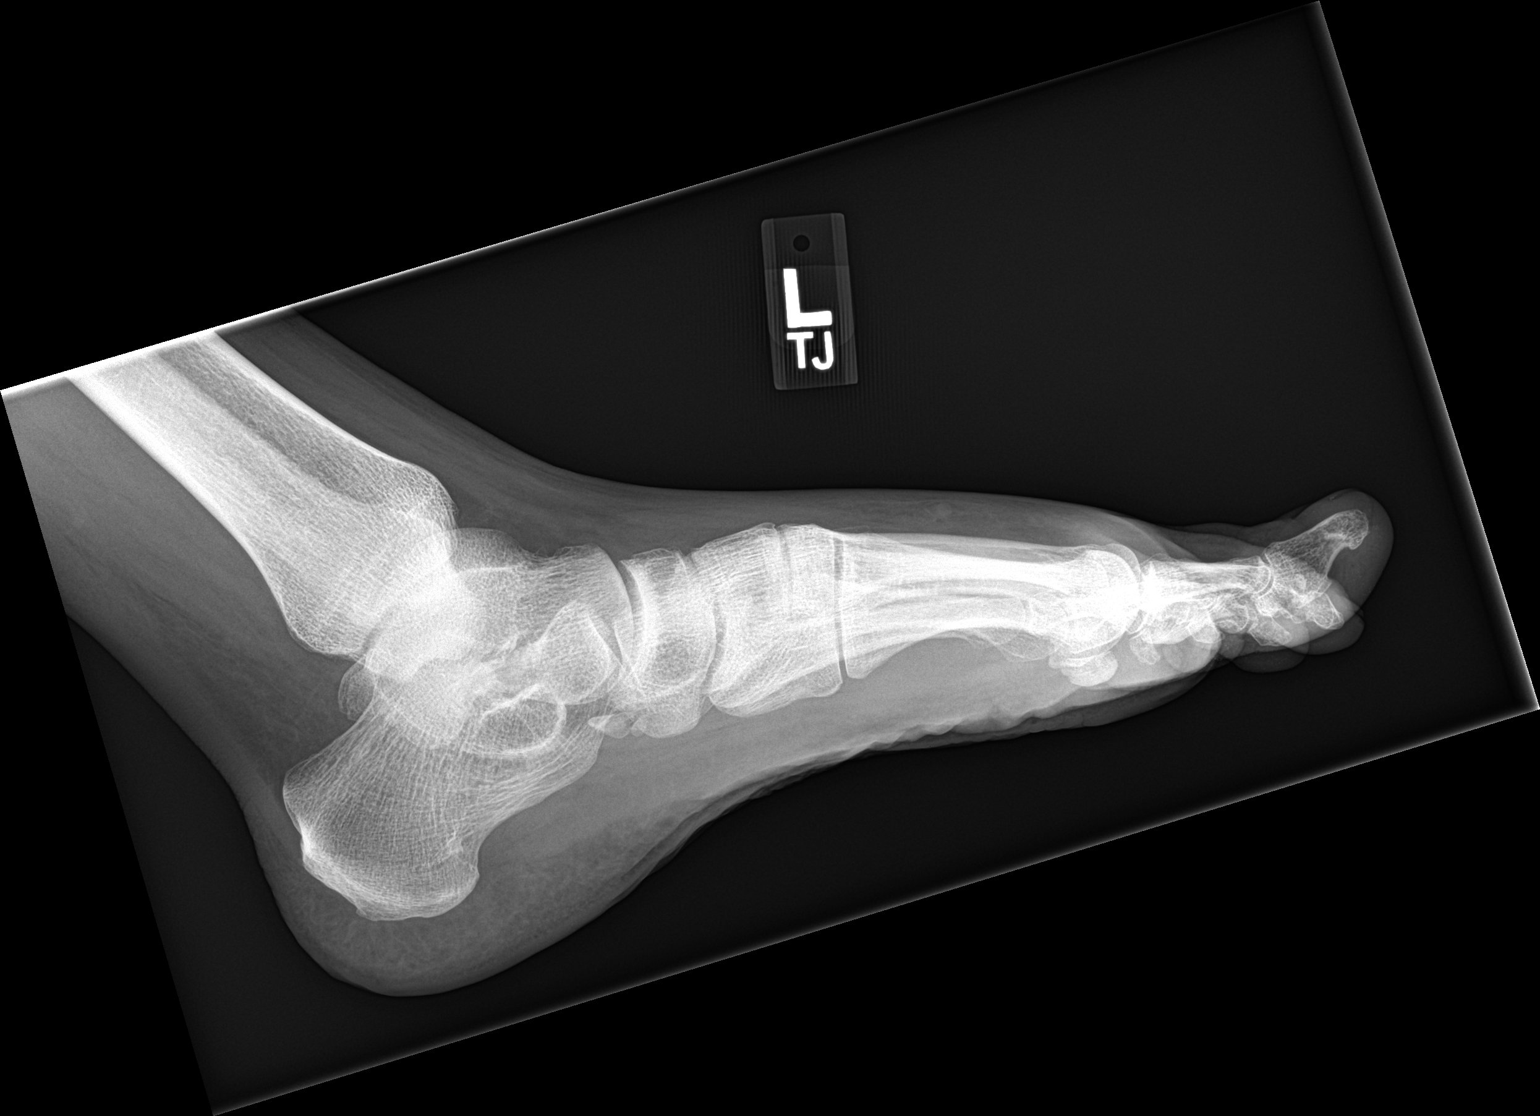

[3 of 3 positions shown; findings below may reference images not displayed]

FINDINGS: Frontal, oblique, lateral views of the left foot are obtained. No
fracture, subluxation, or dislocation. Mild joint space narrowing of
the first metatarsal phalangeal joint. Soft tissues are grossly
unremarkable.
IMPRESSION: 1. Mild osteoarthritis of the first metatarsal phalangeal joint.
2. No acute displaced fracture.

## 2021-03-10 DIAGNOSIS — L218 Other seborrheic dermatitis: Secondary | ICD-10-CM | POA: Diagnosis not present

## 2021-03-10 DIAGNOSIS — L570 Actinic keratosis: Secondary | ICD-10-CM | POA: Diagnosis not present

## 2021-03-10 DIAGNOSIS — X32XXXD Exposure to sunlight, subsequent encounter: Secondary | ICD-10-CM | POA: Diagnosis not present

## 2021-04-08 ENCOUNTER — Other Ambulatory Visit: Payer: Self-pay | Admitting: Family Medicine

## 2021-04-21 ENCOUNTER — Other Ambulatory Visit: Payer: Self-pay | Admitting: Family Medicine

## 2021-05-18 ENCOUNTER — Other Ambulatory Visit: Payer: Self-pay | Admitting: Family Medicine

## 2021-08-30 ENCOUNTER — Other Ambulatory Visit: Payer: Self-pay | Admitting: Family Medicine

## 2021-10-07 ENCOUNTER — Other Ambulatory Visit: Payer: Self-pay | Admitting: Family Medicine

## 2022-01-09 ENCOUNTER — Other Ambulatory Visit: Payer: Self-pay | Admitting: Family Medicine

## 2022-01-10 NOTE — Telephone Encounter (Signed)
Please schedule CPE with fasting labs prior with for Dr. Lorelei Pont.

## 2022-01-11 NOTE — Telephone Encounter (Signed)
Lvmtcb, sent pt a message on mychart

## 2022-01-18 ENCOUNTER — Other Ambulatory Visit: Payer: Self-pay | Admitting: Family Medicine

## 2022-01-18 DIAGNOSIS — R5383 Other fatigue: Secondary | ICD-10-CM

## 2022-01-18 DIAGNOSIS — Z131 Encounter for screening for diabetes mellitus: Secondary | ICD-10-CM

## 2022-01-18 DIAGNOSIS — Z13220 Encounter for screening for lipoid disorders: Secondary | ICD-10-CM

## 2022-01-18 DIAGNOSIS — Z79899 Other long term (current) drug therapy: Secondary | ICD-10-CM

## 2022-01-18 DIAGNOSIS — Z125 Encounter for screening for malignant neoplasm of prostate: Secondary | ICD-10-CM

## 2022-01-25 ENCOUNTER — Other Ambulatory Visit: Payer: BC Managed Care – PPO

## 2022-02-01 ENCOUNTER — Encounter: Payer: BC Managed Care – PPO | Admitting: Family Medicine

## 2022-02-03 ENCOUNTER — Encounter: Payer: BC Managed Care – PPO | Admitting: Family Medicine

## 2022-02-07 ENCOUNTER — Other Ambulatory Visit: Payer: Self-pay | Admitting: Family Medicine

## 2022-03-09 ENCOUNTER — Telehealth: Payer: Self-pay | Admitting: Family Medicine

## 2022-03-09 NOTE — Telephone Encounter (Signed)
Patient called in stating that he has been experiencing dizziness and pressure in his head for the last 3 days, I sent him to access nurse due to his symptoms

## 2022-03-09 NOTE — Progress Notes (Deleted)
Barney Gertsch T. Havish Petties, MD, CAQ Sports Medicine [facility] at [facility] [address] [place], [address]  Phone: [phone]  FAX: [phone]  David Sheppard - 56 y.o. male  MRN [id number]  Date of Birth: Sep 11, 1965  Date: 03/10/2022  PCP: Hannah Beat, MD  Referral: Hannah Beat, MD  No chief complaint on file.  Patient Care Team: Hannah Beat, MD as PCP - General (Family Medicine) Subjective:   David Sheppard is a 56 y.o. pleasant patient who presents with the following:  Preventative Health Maintenance Visit:  Health Maintenance Summary Reviewed and updated, unless pt declines services.  Tobacco History Reviewed. Alcohol: No concerns, no excessive use Exercise Habits: Some activity, rec at least 30 mins 5 times a week STD concerns: no risk or activity to increase risk Drug Use: None  Covid vaccine Shingrix Flu Colon ca screening  Health Maintenance  Topic Date Due   COVID-19 Vaccine (1) Never done   Zoster Vaccines- Shingrix (1 of 2) Never done   COLONOSCOPY (Pts 45-5yrs Insurance coverage will need to be confirmed)  Never done   INFLUENZA VACCINE  Never done   DTaP/Tdap/Td (3 - Td or Tdap) 10/24/2028   Hepatitis C Screening  Completed   HIV Screening  Completed   HPV VACCINES  Aged Out   Immunization History  Administered Date(s) Administered   Td 06/03/2008   Tdap 10/25/2018   Patient Active Problem List   Diagnosis Date Noted   Gallstone pancreatitis    Generalized anxiety disorder 07/30/2011   Panic attacks 07/30/2011   GERD 06/21/2008   MIGRAINE HEADACHE 06/04/2008   ALLERGIC RHINITIS 06/04/2008    Past Medical History:  Diagnosis Date   Allergy    Generalized anxiety disorder 07/30/2011   GERD (gastroesophageal reflux disease)    Migraine    Panic attacks 07/30/2011    Past Surgical History:  Procedure Laterality Date   CHOLECYSTECTOMY N/A 03/17/2018   Procedure: LAPAROSCOPIC CHOLECYSTECTOMY;   Surgeon: Duanne Guess, MD;  Location: [facility] ORS;  Service: General;  Laterality: N/A;   HERNIA REPAIR     TONSILLECTOMY AND ADENOIDECTOMY      Family History  Problem Relation Age of Onset   Hypertension Father    Heart disease Paternal Grandfather    Hyperlipidemia Paternal Grandfather    Diabetes Paternal Grandfather    Hyperlipidemia Paternal Grandmother    Diabetes Paternal Grandmother     Social History   Social History Narrative   1 daily caffiene use   Regular exercise-walking          Past Medical History, Surgical History, Social History, Family History, Problem List, Medications, and Allergies have been reviewed and updated if relevant.  Review of Systems: Pertinent positives are listed above.  Otherwise, a full 14 point review of systems has been done in full and it is negative except where it is noted positive.  Objective:   There were no vitals taken for this visit. Ideal Body Weight:    Ideal Body Weight:   No results found.    01/01/2021    2:16 PM 11/26/2019    2:46 PM 10/25/2018    2:42 PM 08/04/2017    2:49 PM  Depression screen PHQ 2/9  Decreased Interest 0 0 0 0  Down, Depressed, Hopeless 0 0 0 0  PHQ - 2 Score 0 0 0 0     GEN: well developed, well nourished, no acute distress Eyes: conjunctiva and lids normal, PERRLA, EOMI ENT:  TM clear, nares clear, oral exam WNL Neck: supple, no lymphadenopathy, no thyromegaly, no JVD Pulm: clear to auscultation and percussion, respiratory effort normal CV: regular rate and rhythm, S1-S2, no murmur, rub or gallop, no bruits, peripheral pulses normal and symmetric, no cyanosis, clubbing, edema or varicosities GI: soft, non-tender; no hepatosplenomegaly, masses; active bowel sounds all quadrants GU: deferred Lymph: no cervical, axillary or inguinal adenopathy MSK: gait normal, muscle tone and strength WNL, no joint swelling, effusions, discoloration, crepitus  SKIN: clear, good turgor, color WNL, no  rashes, lesions, or ulcerations Neuro: normal mental status, normal strength, sensation, and motion Psych: alert; oriented to person, place and time, normally interactive and not anxious or depressed in appearance.  All labs reviewed with patient. Results for orders placed or performed in visit on 02/10/21  Hepatic function panel  Result Value Ref Range   Total Bilirubin 0.4 0.2 - 1.2 mg/dL   Bilirubin, Direct 0.1 0.0 - 0.3 mg/dL   Alkaline Phosphatase 75 39 - 117 U/L   AST 24 0 - 37 U/L   ALT 40 0 - 53 U/L   Total Protein 7.1 6.0 - 8.3 g/dL   Albumin 4.3 3.5 - 5.2 g/dL    Assessment and Plan:     ICD-10-CM   1. Healthcare maintenance  Z00.00       Health Maintenance Exam: The patient's preventative maintenance and recommended screening tests for an annual wellness exam were reviewed in full today. Brought up to date unless services declined.  Counselled on the importance of diet, exercise, and its role in overall health and mortality. The patient's FH and SH was reviewed, including their home life, tobacco status, and drug and alcohol status.  Follow-up in 1 year for physical exam or additional follow-up below.  Disposition: No follow-ups on file.  No orders of the defined types were placed in this encounter.  There are no discontinued medications. No orders of the defined types were placed in this encounter.   Signed,  Elpidio Galea. Quyen Cutsforth, MD   Allergies as of 03/10/2022   No Known Allergies      Medication List        Accurate as of March 09, 2022  8:19 AM. If you have any questions, ask your nurse or doctor.          citalopram 20 MG tablet Commonly known as: CELEXA TAKE 1 TABLET BY MOUTH EVERY DAY   pantoprazole 40 MG tablet Commonly known as: PROTONIX TAKE 1 TABLET BY MOUTH EVERY DAY   SUMAtriptan 100 MG tablet Commonly known as: IMITREX TAKE 1 TABLET BY MOUTH AS NEEDED FOR MIGRAINE. MAY REPEAT IN 2 HRS IF NEEDED. MAX 2TABS/DAY

## 2022-03-09 NOTE — Telephone Encounter (Signed)
I spoke with pt; pt started on 03/06/22 with fuzzy headedness and pressure feeling in head. Previously had similar feeling 6 mths ago but symptoms did not last that time. No  vision changes, no H/A. Pt feels like pressure in forehead. No way to ck BP. Pt is going to go to [facility] in [place] for eval. Sending note to Dr Patsy Lager who is out of office and [facility] pool.

## 2022-03-09 NOTE — Telephone Encounter (Signed)
[facility] - Client TELEPHONE ADVICE RECORD [facility] Patient Name: David Sheppard Gender: Male DOB: 11/20/1965 Age: 55 Y 20 M 3 D Return Phone Number: [phone] (Primary) Address: City/ State/ Zip: [place] [address] Client [facility] - Client Client Site [place] - Day Provider [place], Frederico Hamman - MD Contact Type Call Who Is Calling Patient / Member / Family / Caregiver Call Type Triage / Clinical Relationship To Patient Self Return Phone Number [phone] (Primary) Chief Complaint Dizziness Reason for Call Symptomatic / Request for [place] states that he has pressure in his head and dizziness. Translation No Nurse Assessment Nurse: Redmond Pulling, RN, Levada Dy Date/Time Eilene Ghazi Time): 03/09/2022 11:09:16 AM Confirm and document reason for call. If symptomatic, describe symptoms. ---Caller states that he has pressure in his head and dizziness. Symptoms started on Saturday. Does the patient have any new or worsening symptoms? ---Yes Will a triage be completed? ---Yes Related visit to physician within the last 2 weeks? ---No Does the PT have any chronic conditions? (i.e. diabetes, asthma, this includes High risk factors for pregnancy, etc.) ---No Is this a behavioral health or substance abuse call? ---No Guidelines Guideline Title Affirmed Question Affirmed Notes Nurse Date/Time (Eastern Time) Dizziness - Lightheadedness [1] MILD dizziness (e.g., walking normally) AND [2] has NOT been evaluated by doctor (or NP/PA) for this (Exception: Dizziness caused by heat exposure, sudden standing, or poor fluid intake.) Redmond Pulling, RN, Levada Dy 03/09/2022 11:10:24 AM Headache [1] New headache AND [2] age > 11 Jannet Askew 03/09/2022 11:15:21 AM PLEASE NOTE: All timestamps contained within this report are represented as [place] Standard  Time. CONFIDENTIALTY NOTICE: This fax transmission is intended only for the addressee. It contains information that is legally privileged, confidential or otherwise protected from use or disclosure. If you are not the intended recipient, you are strictly prohibited from reviewing, disclosing, copying using or disseminating any of this information or taking any action in reliance on or regarding this information. If you have received this fax in error, please notify us immediately by telephone so that we can arrange for its return to [place]. Phone: [phone], Toll-Free: [phone], Fax: [phone] Page: 2 of 2 Call Id: [id number] [place]. Time Eilene Ghazi Time) Disposition Final User 03/09/2022 11:14:42 AM See PCP within 24 Hours Redmond Pulling, RN, Levada Dy 03/09/2022 11:17:58 AM See PCP within 24 Hours Yes Redmond Pulling, RN, Levada Dy Final Disposition 03/09/2022 11:17:58 AM See PCP within 24 Hours Yes Redmond Pulling, RN, Levada Dy Disposition Overriden: SEE PCP WITHIN 3 DAYS Override Reason: Patient's symptoms need a higher level of care Caller Disagree/Comply Comply Caller Understands Yes PreDisposition Did not know what to do Care Advice Given Per Guideline SEE PCP WITHIN 24 HOURS: * IF OFFICE WILL BE OPEN: You need to be examined within the next 24 hours. Call your doctor (or NP/PA) when the office opens and make an appointment. DRINK FLUIDS: * This will improve hydration and blood glucose. CALL BACK IF: * Passes out (faints) * You become worse SEE PCP WITHIN 24 HOURS: * IF OFFICE WILL BE OPEN: You need to be examined within the next 24 hours. Call your doctor (or NP/PA) when the office opens and make an appointment. * IF OFFICE WILL BE CLOSED: You need to be seen within the next 24 hours. A clinic or an urgent care center is often a good source of care if your doctor's office is closed or you can't get an appointment. * For pain relief,  you can take either acetaminophen, ibuprofen, or naproxen. * They are  over-the-counter (OTC) pain drugs. You can buy them at the drugstore. COLD PACK FOR HEADACHE: * Put a cold pack or an ice bag (wrapped in a moist towel) on the forehead * Do this for 20 minutes. CALL BACK IF: * Blurred vision or double vision occurs * Numbness or weakness of the face, arm or leg * Difficulty with speaking * You become worse CARE ADVICE given per Headache (Adult) guideline. Comments User: Donney Dice, RN Date/Time Lamount Cohen Time): 03/09/2022 11:20:08 AM Patient states he called the office and was told no appointment today. Referrals REFERRED TO PCP OFFICE GO TO FACILITY UNDECIDE

## 2022-03-10 ENCOUNTER — Encounter: Payer: BC Managed Care – PPO | Admitting: Family Medicine

## 2022-03-17 ENCOUNTER — Other Ambulatory Visit: Payer: Self-pay | Admitting: Family Medicine

## 2022-03-25 ENCOUNTER — Encounter: Payer: Self-pay | Admitting: Family Medicine

## 2022-03-25 ENCOUNTER — Ambulatory Visit (INDEPENDENT_AMBULATORY_CARE_PROVIDER_SITE_OTHER): Payer: BC Managed Care – PPO | Admitting: Family Medicine

## 2022-03-25 VITALS — BP 130/86 | HR 77 | Temp 98.1°F | Ht 67.5 in | Wt 186.1 lb

## 2022-03-25 DIAGNOSIS — Z131 Encounter for screening for diabetes mellitus: Secondary | ICD-10-CM | POA: Diagnosis not present

## 2022-03-25 DIAGNOSIS — Z13220 Encounter for screening for lipoid disorders: Secondary | ICD-10-CM

## 2022-03-25 DIAGNOSIS — Z0000 Encounter for general adult medical examination without abnormal findings: Secondary | ICD-10-CM | POA: Diagnosis not present

## 2022-03-25 DIAGNOSIS — Z1211 Encounter for screening for malignant neoplasm of colon: Secondary | ICD-10-CM

## 2022-03-25 DIAGNOSIS — Z125 Encounter for screening for malignant neoplasm of prostate: Secondary | ICD-10-CM

## 2022-03-25 DIAGNOSIS — Z79899 Other long term (current) drug therapy: Secondary | ICD-10-CM | POA: Diagnosis not present

## 2022-03-25 MED ORDER — CITALOPRAM HYDROBROMIDE 20 MG PO TABS: 20.0000 mg | ORAL_TABLET | Freq: Every day | ORAL | 3 refills | Status: DC

## 2022-03-25 MED ORDER — PANTOPRAZOLE SODIUM 40 MG PO TBEC: 40.0000 mg | DELAYED_RELEASE_TABLET | Freq: Every day | ORAL | 3 refills | Status: DC

## 2022-03-25 NOTE — Progress Notes (Signed)
David Monrroy T. Lowell Mcgurk, MD, [facility] at [facility] [place], [address]  Phone: [phone]  FAX: [phone]  David Sheppard - 57 y.o. male  MRN [id number]  Date of Birth: 05-05-1965  Date: 03/25/2022  PCP: Owens Loffler, MD  Referral: Owens Loffler, MD  Chief Complaint  Patient presents with   Annual Exam   Patient Care Team: Owens Loffler, MD as PCP - General (Family Medicine) Subjective:   David Sheppard is a 57 y.o. pleasant patient who presents with the following:  Preventative Health Maintenance Visit:  He is a generally healthy gentleman, he rarely comes into the office except for complete physical examination.  He has no specific complaints today.  Health Maintenance Summary Reviewed and updated, unless pt declines services.  Tobacco History Reviewed. Vapes. But fully quit cigarettes.  Alcohol: No concerns, no excessive use.  Weekends.  Exercise Habits: Some activity, rec at least 30 mins 5 times a week STD concerns: no risk or activity to increase risk Drug Use: None  Vapes - most every day   Never gets vaccines Shingrix Covid Flu - declines  Colon cancer screening - will refer for colonoscopy  Wt Readings from Last 3 Encounters:  03/25/22 186 lb 2 oz (84.4 kg)  01/01/21 184 lb 8 oz (83.7 kg)  04/29/20 189 lb (85.7 kg)      Health Maintenance  Topic Date Due   COLONOSCOPY (Pts 45-25yrs Insurance coverage will need to be confirmed)  Never done   COVID-19 Vaccine (1) 04/10/2022 (Originally 09/04/1970)   INFLUENZA VACCINE  06/13/2022 (Originally 10/13/2021)   Zoster Vaccines- Shingrix (1 of 2) 06/24/2022 (Originally 09/05/1986)   DTaP/Tdap/Td (3 - Td or Tdap) 10/24/2028   Hepatitis C Screening  Completed   HIV Screening  Completed   HPV VACCINES  Aged Out   Immunization History  Administered Date(s) Administered   Td 06/03/2008   Tdap 10/25/2018   Patient Active Problem  List   Diagnosis Date Noted   Gallstone pancreatitis    Generalized anxiety disorder 07/30/2011   Panic attacks 07/30/2011   GERD 06/21/2008   MIGRAINE HEADACHE 06/04/2008   ALLERGIC RHINITIS 06/04/2008    Past Medical History:  Diagnosis Date   Allergy    Generalized anxiety disorder 07/30/2011   GERD (gastroesophageal reflux disease)    Migraine    Panic attacks 07/30/2011    Past Surgical History:  Procedure Laterality Date   CHOLECYSTECTOMY N/A 03/17/2018   Procedure: LAPAROSCOPIC CHOLECYSTECTOMY;  Surgeon: Fredirick Maudlin, MD;  Location: [facility] OR[facility];  Service: General;  Laterality: N/A;   HERNIA REPAIR     TONSILLECTOMY AND ADENOIDECTOMY      Family History  Problem Relation Age of Onset   Hypertension Father    Heart disease Paternal Grandfather    Hyperlipidemia Paternal Grandfather    Diabetes Paternal Grandfather    Hyperlipidemia Paternal Grandmother    Diabetes Paternal Grandmother     Social History   Social History Narrative   1 daily caffiene use   Regular exercise-walking          Past Medical History, Surgical History, Social History, Family History, Problem List, Medications, and Allergies have been reviewed and updated if relevant.  Review of Systems: Pertinent positives are listed above.  Otherwise, a full 14 point review of systems has been done in full and it is negative except where it is noted positive.  Objective:   BP 130/86   Pulse  77   Temp 98.1 F (36.7 C) (Oral)   Ht 5' 7.5" (1.715 m)   Wt 186 lb 2 oz (84.4 kg)   SpO2 98%   BMI 28.72 kg/m  Ideal Body Weight: Weight in (lb) to have BMI = 25: 161.7  Ideal Body Weight: Weight in (lb) to have BMI = 25: 161.7 No results found.    03/25/2022    3:41 PM 01/01/2021    2:16 PM 11/26/2019    2:46 PM 10/25/2018    2:42 PM 08/04/2017    2:49 PM  Depression screen PHQ 2/9  Decreased Interest 0 0 0 0 0  Down, Depressed, Hopeless 0 0 0 0 0  PHQ - 2 Score 0 0 0 0 0     GEN: well  developed, well nourished, no acute distress Eyes: conjunctiva and lids normal, PERRLA, EOMI ENT: TM clear, nares clear, oral exam WNL Neck: supple, no lymphadenopathy, no thyromegaly, no JVD Pulm: clear to auscultation and percussion, respiratory effort normal CV: regular rate and rhythm, S1-S2, no murmur, rub or gallop, no bruits, peripheral pulses normal and symmetric, no cyanosis, clubbing, edema or varicosities GI: soft, non-tender; no hepatosplenomegaly, masses; active bowel sounds all quadrants GU: deferred Lymph: no cervical, axillary or inguinal adenopathy MSK: gait normal, muscle tone and strength WNL, no joint swelling, effusions, discoloration, crepitus  SKIN: clear, good turgor, color WNL, no rashes, lesions, or ulcerations Neuro: normal mental status, normal strength, sensation, and motion Psych: alert; oriented to person, place and time, normally interactive and not anxious or depressed in appearance.  All labs reviewed with patient. Results for orders placed or performed in visit on 02/10/21  Hepatic function panel  Result Value Ref Range   Total Bilirubin 0.4 0.2 - 1.2 mg/dL   Bilirubin, Direct 0.1 0.0 - 0.3 mg/dL   Alkaline Phosphatase 75 39 - 117 U/L   AST 24 0 - 37 U/L   ALT 40 0 - 53 U/L   Total Protein 7.1 6.0 - 8.3 g/dL   Albumin 4.3 3.5 - 5.2 g/dL    Assessment and Plan:     ICD-10-CM   1. Healthcare maintenance  Z00.00     2. Screening PSA (prostate specific antigen)  Z12.5 PSA, Total with Reflex to PSA, Free    3. Screening for diabetes mellitus  Z13.1 Hemoglobin A1c    4. Encounter for long-term (current) use of medications  Z79.899 CBC with Differential/Platelet    Basic metabolic panel    Hepatic function panel    5. Screening, lipid  Z13.220 Lipid panel     He declines all vaccination.  We will check all basic laboratories today.  We also talked about vaping.  He will hopefully cut this back.  Health Maintenance Exam: The patient's  preventative maintenance and recommended screening tests for an annual wellness exam were reviewed in full today. Brought up to date unless services declined.  Counselled on the importance of diet, exercise, and its role in overall health and mortality. The patient's FH and SH was reviewed, including their home life, tobacco status, and drug and alcohol status.  Follow-up in 1 year for physical exam or additional follow-up below.  Disposition: No follow-ups on file.  No orders of the defined types were placed in this encounter.  There are no discontinued medications. No orders of the defined types were placed in this encounter.   Signed,  Maud Deed. Sharad Vaneaton, MD   Allergies as of 03/25/2022   No Known Allergies  Medication List        Accurate as of March 25, 2022  3:47 PM. If you have any questions, ask your nurse or doctor.          citalopram 20 MG tablet Commonly known as: CELEXA TAKE 1 TABLET BY MOUTH EVERY DAY   pantoprazole 40 MG tablet Commonly known as: PROTONIX TAKE 1 TABLET BY MOUTH EVERY DAY   SUMAtriptan 100 MG tablet Commonly known as: IMITREX TAKE 1 TABLET BY MOUTH AS NEEDED FOR MIGRAINE. MAY REPEAT IN 2 HRS IF NEEDED. MAX 2TABS/DAY

## 2022-03-26 ENCOUNTER — Other Ambulatory Visit: Payer: Self-pay

## 2022-03-26 ENCOUNTER — Other Ambulatory Visit: Payer: Self-pay | Admitting: *Deleted

## 2022-03-26 ENCOUNTER — Telehealth: Payer: Self-pay

## 2022-03-26 DIAGNOSIS — Z1211 Encounter for screening for malignant neoplasm of colon: Secondary | ICD-10-CM

## 2022-03-26 LAB — CBC WITH DIFFERENTIAL/PLATELET
Basophils Absolute: 0.1 10*3/uL (ref 0.0–0.1)
Basophils Relative: 1 % (ref 0.0–3.0)
Eosinophils Absolute: 0.2 10*3/uL (ref 0.0–0.7)
Eosinophils Relative: 2.7 % (ref 0.0–5.0)
HCT: 43.6 % (ref 39.0–52.0)
Hemoglobin: 15.2 g/dL (ref 13.0–17.0)
Lymphocytes Relative: 27.7 % (ref 12.0–46.0)
Lymphs Abs: 2 10*3/uL (ref 0.7–4.0)
MCHC: 34.8 g/dL (ref 30.0–36.0)
MCV: 93.3 fl (ref 78.0–100.0)
Monocytes Absolute: 0.5 10*3/uL (ref 0.1–1.0)
Monocytes Relative: 7.7 % (ref 3.0–12.0)
Neutro Abs: 4.3 10*3/uL (ref 1.4–7.7)
Neutrophils Relative %: 60.9 % (ref 43.0–77.0)
Platelets: 220 10*3/uL (ref 150.0–400.0)
RBC: 4.67 Mil/uL (ref 4.22–5.81)
RDW: 13 % (ref 11.5–15.5)
WBC: 7.1 10*3/uL (ref 4.0–10.5)

## 2022-03-26 LAB — PSA, TOTAL WITH REFLEX TO PSA, FREE: PSA, Total: 0.4 ng/mL (ref <=4.0)

## 2022-03-26 LAB — BASIC METABOLIC PANEL
BUN: 14 mg/dL (ref 6–23)
CO2: 29 mEq/L (ref 19–32)
Calcium: 9.3 mg/dL (ref 8.4–10.5)
Chloride: 102 mEq/L (ref 96–112)
Creatinine, Ser: 1.01 mg/dL (ref 0.40–1.50)
GFR: 84.29 mL/min (ref >60.00)
Glucose, Bld: 84 mg/dL (ref 70–99)
Potassium: 4.4 mEq/L (ref 3.5–5.1)
Sodium: 138 mEq/L (ref 135–145)

## 2022-03-26 LAB — HEPATIC FUNCTION PANEL
ALT: 35 U/L (ref 0–53)
AST: 24 U/L (ref 0–37)
Albumin: 4.5 g/dL (ref 3.5–5.2)
Alkaline Phosphatase: 81 U/L (ref 39–117)
Bilirubin, Direct: 0.1 mg/dL (ref 0.0–0.3)
Total Bilirubin: 0.5 mg/dL (ref 0.2–1.2)
Total Protein: 7 g/dL (ref 6.0–8.3)

## 2022-03-26 LAB — LIPID PANEL
Cholesterol: 217 mg/dL — ABNORMAL HIGH (ref 0–200)
HDL: 50.1 mg/dL (ref >39.00)
LDL Cholesterol: 142 mg/dL — ABNORMAL HIGH (ref 0–99)
NonHDL: 166.61
Total CHOL/HDL Ratio: 4
Triglycerides: 124 mg/dL (ref 0.0–149.0)
VLDL: 24.8 mg/dL (ref 0.0–40.0)

## 2022-03-26 LAB — HEMOGLOBIN A1C: Hgb A1c MFr Bld: 5.3 % (ref 4.6–6.5)

## 2022-03-26 MED ORDER — PANTOPRAZOLE SODIUM 40 MG PO TBEC: 40.0000 mg | DELAYED_RELEASE_TABLET | Freq: Every day | ORAL | 3 refills | Status: DC

## 2022-03-26 MED ORDER — SUMATRIPTAN SUCCINATE 100 MG PO TABS: ORAL_TABLET | ORAL | 5 refills | Status: DC

## 2022-03-26 MED ORDER — NA SULFATE-K SULFATE-MG SULF 17.5-3.13-1.6 GM/177ML PO SOLN: 1.0000 | Freq: Once | ORAL | 0 refills | Status: AC

## 2022-03-26 MED ORDER — CITALOPRAM HYDROBROMIDE 20 MG PO TABS: 20.0000 mg | ORAL_TABLET | Freq: Every day | ORAL | 3 refills | Status: DC

## 2022-03-26 NOTE — Progress Notes (Signed)
Gastroenterology Pre-Procedure Review  Request Date: 04/30/22 Requesting Physician: Dr. Vicente Males  PATIENT REVIEW QUESTIONS: The patient responded to the following health history questions as indicated:    1. Are you having any GI issues? no 2. Do you have a personal history of Polyps? no 3. Do you have a family history of Colon Cancer or Polyps? no 4. Diabetes Mellitus? no 5. Joint replacements in the past 12 months?no 6. Major health problems in the past 3 months?no 7. Any artificial heart valves, MVP, or defibrillator?no    MEDICATIONS & ALLERGIES:    Patient reports the following regarding taking any anticoagulation/antiplatelet therapy:   Plavix, Coumadin, Eliquis, Xarelto, Lovenox, Pradaxa, Brilinta, or Effient? no Aspirin? no  Patient confirms/reports the following medications:  Current Outpatient Medications  Medication Sig Dispense Refill   citalopram (CELEXA) 20 MG tablet Take 1 tablet (20 mg total) by mouth daily. 90 tablet 3   pantoprazole (PROTONIX) 40 MG tablet Take 1 tablet (40 mg total) by mouth daily. 90 tablet 3   SUMAtriptan (IMITREX) 100 MG tablet TAKE 1 TABLET BY MOUTH AS NEEDED FOR MIGRAINE. MAY REPEAT IN 2 HRS IF NEEDED. MAX 2TABS/DAY 9 tablet 5   No current facility-administered medications for this visit.    Patient confirms/reports the following allergies:  No Known Allergies  No orders of the defined types were placed in this encounter.   AUTHORIZATION INFORMATION Primary Insurance: 1D#: Group #:  Secondary Insurance: 1D#: Group #:  SCHEDULE INFORMATION: Date: 04/30/22 Time: Location: [facility]

## 2022-03-26 NOTE — Telephone Encounter (Signed)
Gastroenterology Pre-Procedure Review  Request Date: 04/30/22 Requesting Physician: Dr. Vicente Males  PATIENT REVIEW QUESTIONS: The patient responded to the following health history questions as indicated:    1. Are you having any GI issues? no 2. Do you have a personal history of Polyps? no 3. Do you have a family history of Colon Cancer or Polyps? no 4. Diabetes Mellitus? no 5. Joint replacements in the past 12 months?no 6. Major health problems in the past 3 months?no 7. Any artificial heart valves, MVP, or defibrillator?no    MEDICATIONS & ALLERGIES:    Patient reports the following regarding taking any anticoagulation/antiplatelet therapy:   Plavix, Coumadin, Eliquis, Xarelto, Lovenox, Pradaxa, Brilinta, or Effient? no Aspirin? no  Patient confirms/reports the following medications:  Current Outpatient Medications  Medication Sig Dispense Refill   Na Sulfate-K Sulfate-Mg Sulf 17.5-3.13-1.6 GM/177ML SOLN Take 1 kit by mouth once for 1 dose. 354 mL 0   citalopram (CELEXA) 20 MG tablet Take 1 tablet (20 mg total) by mouth daily. 90 tablet 3   pantoprazole (PROTONIX) 40 MG tablet Take 1 tablet (40 mg total) by mouth daily. 90 tablet 3   SUMAtriptan (IMITREX) 100 MG tablet TAKE 1 TABLET BY MOUTH AS NEEDED FOR MIGRAINE. MAY REPEAT IN 2 HRS IF NEEDED. MAX 2TABS/DAY 9 tablet 5   No current facility-administered medications for this visit.    Patient confirms/reports the following allergies:  No Known Allergies  No orders of the defined types were placed in this encounter.   AUTHORIZATION INFORMATION Primary Insurance: 1D#: Group #:  Secondary Insurance: 1D#: Group #:  SCHEDULE INFORMATION: Date: 04/30/22 Time: Location: [facility]

## 2022-04-01 ENCOUNTER — Telehealth: Payer: Self-pay

## 2022-04-01 NOTE — Telephone Encounter (Signed)
Patient call has been returned.  Patient has been informed that he will call to the hospital Endoscopy Dept the day before his procedure to get his arrival time and normally the procedures are performed between 7am-12pm.  Thanks,  Sharyn Lull, CMA

## 2022-04-29 ENCOUNTER — Encounter: Payer: Self-pay | Admitting: Gastroenterology

## 2022-04-30 ENCOUNTER — Ambulatory Visit: Payer: BC Managed Care – PPO | Admitting: Anesthesiology

## 2022-04-30 ENCOUNTER — Encounter
Admission: RE | Disposition: A | Discharge status: Home / Self Care | Payer: Self-pay | Source: Ambulatory Visit | Attending: Gastroenterology

## 2022-04-30 ENCOUNTER — Ambulatory Visit
Admission: RE | Admit: 2022-04-30 | Discharge: 2022-04-30 | Disposition: A | Discharge status: Home / Self Care | Payer: BC Managed Care – PPO | Source: Ambulatory Visit | Attending: Gastroenterology | Admitting: Gastroenterology

## 2022-04-30 DIAGNOSIS — Z1211 Encounter for screening for malignant neoplasm of colon: Secondary | ICD-10-CM | POA: Insufficient documentation

## 2022-04-30 DIAGNOSIS — F17210 Nicotine dependence, cigarettes, uncomplicated: Secondary | ICD-10-CM | POA: Diagnosis not present

## 2022-04-30 DIAGNOSIS — J449 Chronic obstructive pulmonary disease, unspecified: Secondary | ICD-10-CM | POA: Insufficient documentation

## 2022-04-30 DIAGNOSIS — K219 Gastro-esophageal reflux disease without esophagitis: Secondary | ICD-10-CM | POA: Diagnosis not present

## 2022-04-30 HISTORY — PX: COLONOSCOPY WITH PROPOFOL: SHX5780

## 2022-04-30 SURGERY — COLONOSCOPY WITH PROPOFOL
Anesthesia: General

## 2022-04-30 MED ORDER — PROPOFOL 500 MG/50ML IV EMUL
INTRAVENOUS | Status: DC | PRN
  Administered 2022-04-30: 150 ug/kg/min via INTRAVENOUS

## 2022-04-30 MED ORDER — PROPOFOL 10 MG/ML IV BOLUS
INTRAVENOUS | Status: DC | PRN
  Administered 2022-04-30: 80 mg via INTRAVENOUS

## 2022-04-30 MED ORDER — PHENYLEPHRINE HCL (PRESSORS) 10 MG/ML IV SOLN
INTRAVENOUS | Status: DC | PRN
  Administered 2022-04-30 (×2): 160 ug via INTRAVENOUS

## 2022-04-30 MED ORDER — LIDOCAINE HCL (CARDIAC) PF 100 MG/5ML IV SOSY
PREFILLED_SYRINGE | INTRAVENOUS | Status: DC | PRN
  Administered 2022-04-30: 40 mg via INTRAVENOUS

## 2022-04-30 MED ORDER — SIMETHICONE 40 MG/0.6ML PO SUSP
ORAL | Status: DC | PRN
  Administered 2022-04-30: 120 mL

## 2022-04-30 MED ORDER — SODIUM CHLORIDE 0.9 % IV SOLN
INTRAVENOUS | Status: DC
  Administered 2022-04-30: 20 mL/h via INTRAVENOUS

## 2022-04-30 MED ORDER — DEXMEDETOMIDINE HCL IN NACL 200 MCG/50ML IV SOLN
INTRAVENOUS | Status: DC | PRN
  Administered 2022-04-30: 8 ug via INTRAVENOUS

## 2022-04-30 MED ORDER — PROPOFOL 1000 MG/100ML IV EMUL
INTRAVENOUS | Status: AC
  Filled 2022-04-30: qty 100

## 2022-04-30 NOTE — Anesthesia Postprocedure Evaluation (Signed)
Anesthesia Post Note  Patient: David Sheppard  Procedure(s) Performed: COLONOSCOPY WITH PROPOFOL  Patient location during evaluation: PACU Anesthesia Type: General Level of consciousness: awake Pain management: pain level controlled Respiratory status: spontaneous breathing Cardiovascular status: stable Anesthetic complications: no   No notable events documented.   Last Vitals:  Vitals:   04/30/22 0805 04/30/22 0822  BP: (!) 100/56 115/86  Pulse: 71 72  Resp:    Temp:    SpO2: 94%     Last Pain:  Vitals:   04/30/22 0810  TempSrc:   PainSc: 0-No pain                 VAN STAVEREN,Bow Buntyn

## 2022-04-30 NOTE — Op Note (Signed)
[facility] Gastroenterology Patient Name: David Sheppard Procedure Date: 04/30/2022 7:20 AM MRN: [id number] Account #: [id number] Date of Birth: May 26, 1965 Admit Type: Outpatient Age: 57 Room: [facility] ENDO ROOM 4 Gender: Male Note Status: Finalized Instrument Name: Park Meo [id number] Procedure:             Colonoscopy Indications:           Screening for colorectal malignant neoplasm Providers:             Jonathon Bellows MD, MD Referring MD:          Maud Deed. Copland MD, MD (Referring MD) Medicines:             Monitored Anesthesia Care Complications:         No immediate complications. Procedure:             Pre-Anesthesia Assessment:                        - Prior to the procedure, a History and Physical was                         performed, and patient medications, allergies and                         sensitivities were reviewed. The patient's tolerance                         of previous anesthesia was reviewed.                        - The risks and benefits of the procedure and the                         sedation options and risks were discussed with the                         patient. All questions were answered and informed                         consent was obtained.                        - ASA Grade Assessment: II - A patient with mild                         systemic disease.                        After obtaining informed consent, the colonoscope was                         passed under direct vision. Throughout the procedure,                         the patient's blood pressure, pulse, and oxygen                         saturations were monitored continuously. The                         Colonoscope  was introduced through the anus and                         advanced to the the cecum, identified by the                         appendiceal orifice. The colonoscopy was performed                         with ease. The patient tolerated the procedure  well.                         The quality of the bowel preparation was good. The                         ileocecal valve, appendiceal orifice, and rectum were                         photographed. Findings:      The entire examined colon appeared normal on direct and retroflexion       views. Impression:            - The entire examined colon is normal on direct and                         retroflexion views.                        - No specimens collected. Recommendation:        - Discharge patient to home (with escort).                        - Resume previous diet.                        - Continue present medications.                        - Repeat colonoscopy in 10 years for screening                         purposes. Procedure Code(s):     --- Professional ---                        [phone], Colonoscopy, flexible; diagnostic, including                         collection of specimen(s) by brushing or washing, when                         performed (separate procedure) Diagnosis Code(s):     --- Professional ---                        Z12.11, Encounter for screening for malignant neoplasm                         of colon CPT copyright 2022 American Medical Association. All rights reserved. The codes documented in this report are preliminary and upon coder review may  be revised to  meet current compliance requirements. Jonathon Bellows, MD Jonathon Bellows MD, MD 04/30/2022 7:59:48 AM This report has been signed electronically. Number of Addenda: 0 Note Initiated On: 04/30/2022 7:20 AM Scope Withdrawal Time: 0 hours 8 minutes 38 seconds  Total Procedure Duration: 0 hours 11 minutes 44 seconds  Estimated Blood Loss:  Estimated blood loss: none.      [facility]

## 2022-04-30 NOTE — Anesthesia Procedure Notes (Signed)
Date/Time: 04/30/2022 7:53 AM  Performed by: Doreen Salvage, CRNAPre-anesthesia Checklist: Patient identified, Emergency Drugs available, Suction available and Patient being monitored Patient Re-evaluated:Patient Re-evaluated prior to induction Oxygen Delivery Method: Nasal cannula Induction Type: IV induction Dental Injury: Teeth and Oropharynx as per pre-operative assessment  Comments: Nasal cannula with etCO2 monitoring

## 2022-04-30 NOTE — H&P (Signed)
Jonathon Bellows, MD [address], [place], [place], [place], [address] [address], [place], [place], [place], [address] Phone: [phone]  Fax: [phone]  Primary Care Physician:  Owens Loffler, MD   Pre-Procedure History & Physical: HPI:  David Sheppard is a 57 y.o. male is here for an colonoscopy.   Past Medical History:  Diagnosis Date   Allergy    Generalized anxiety disorder 07/30/2011   GERD (gastroesophageal reflux disease)    Migraine    Panic attacks 07/30/2011    Past Surgical History:  Procedure Laterality Date   CHOLECYSTECTOMY N/A 03/17/2018   Procedure: LAPAROSCOPIC CHOLECYSTECTOMY;  Surgeon: Fredirick Maudlin, MD;  Location: [facility] ORS;  Service: General;  Laterality: N/A;   HERNIA REPAIR     TONSILLECTOMY AND ADENOIDECTOMY      Prior to Admission medications   Medication Sig Start Date End Date Taking? Authorizing Provider  citalopram (CELEXA) 20 MG tablet Take 1 tablet (20 mg total) by mouth daily. 03/26/22  Yes Copland, Frederico Hamman, MD  pantoprazole (PROTONIX) 40 MG tablet Take 1 tablet (40 mg total) by mouth daily. 03/26/22  Yes Copland, Frederico Hamman, MD  SUMAtriptan (IMITREX) 100 MG tablet TAKE 1 TABLET BY MOUTH AS NEEDED FOR MIGRAINE. MAY REPEAT IN 2 HRS IF NEEDED. MAX 2TABS/DAY 03/26/22  Yes Copland, Frederico Hamman, MD    Allergies as of 03/26/2022   (No Known Allergies)    Family History  Problem Relation Age of Onset   Hypertension Father    Heart disease Paternal Grandfather    Hyperlipidemia Paternal Grandfather    Diabetes Paternal Grandfather    Hyperlipidemia Paternal Grandmother    Diabetes Paternal Grandmother     Social History   Socioeconomic History   Marital status: Married    Spouse name: Not on file   Number of children: 2   Years of education: Not on file   Highest education level: Not on file  Occupational History   Occupation: injection mold setup machinery   Tobacco Use   Smoking status: Every Day    Packs/day: 0.50     Years: 10.00    Total pack years: 5.00    Types: Cigarettes, E-cigarettes   Smokeless tobacco: Former  Scientific laboratory technician Use: Never used  Substance and Sexual Activity   Alcohol use: Yes    Alcohol/week: 0.0 standard drinks of alcohol    Comment: occ   Drug use: No   Sexual activity: Not on file  Other Topics Concern   Not on file  Social History Narrative   1 daily caffiene use   Regular exercise-walking         Social Determinants of Health   Financial Resource Strain: Not on file  Food Insecurity: Not on file  Transportation Needs: Not on file  Physical Activity: Not on file  Stress: Not on file  Social Connections: Not on file  Intimate Partner Violence: Not on file    Review of Systems: See HPI, otherwise negative ROS  Physical Exam: BP 120/86   Pulse 93   Temp (!) 96.2 F (35.7 C) (Temporal)   Resp 20   Ht 5' 8"$  (1.727 m)   Wt 82.5 kg   SpO2 99%   BMI 27.64 kg/m  General:   Alert,  pleasant and cooperative in NAD Head:  Normocephalic and atraumatic. Neck:  Supple; no masses or thyromegaly. Lungs:  Clear throughout to auscultation, normal respiratory effort.    Heart:  +S1, +S2, Regular rate  and rhythm, No edema. Abdomen:  Soft, nontender and nondistended. Normal bowel sounds, without guarding, and without rebound.   Neurologic:  Alert and  oriented x4;  grossly normal neurologically.  Impression/Plan: David Sheppard is here for an colonoscopy to be performed for Screening colonoscopy average risk   Risks, benefits, limitations, and alternatives regarding  colonoscopy have been reviewed with the patient.  Questions have been answered.  All parties agreeable.   Jonathon Bellows, MD  04/30/2022, 7:40 AM

## 2022-04-30 NOTE — Anesthesia Preprocedure Evaluation (Signed)
Anesthesia Evaluation  Patient identified by MRN, date of birth, ID band Patient awake    Reviewed: Allergy & Precautions, NPO status , Patient's Chart, lab work & pertinent test results  Airway Mallampati: II  TM Distance: >3 FB Neck ROM: full    Dental  (+) Teeth Intact   Pulmonary neg pulmonary ROS, COPD, Current Smoker and Patient abstained from smoking.   Pulmonary exam normal        Cardiovascular Exercise Tolerance: Good negative cardio ROS Normal cardiovascular exam Rhythm:Regular     Neuro/Psych   Anxiety     negative neurological ROS  negative psych ROS   GI/Hepatic negative GI ROS, Neg liver ROS,GERD  ,,  Endo/Other  negative endocrine ROS    Renal/GU negative Renal ROS  negative genitourinary   Musculoskeletal   Abdominal Normal abdominal exam  (+)   Peds negative pediatric ROS (+)  Hematology negative hematology ROS (+)   Anesthesia Other Findings Past Medical History: No date: Allergy 07/30/2011: Generalized anxiety disorder No date: GERD (gastroesophageal reflux disease) No date: Migraine 07/30/2011: Panic attacks  Past Surgical History: 03/17/2018: CHOLECYSTECTOMY; N/A     Comment:  Procedure: LAPAROSCOPIC CHOLECYSTECTOMY;  Surgeon:               Fredirick Maudlin, MD;  Location: [facility] ORS;  Service:               General;  Laterality: N/A; No date: HERNIA REPAIR No date: TONSILLECTOMY AND ADENOIDECTOMY  BMI    Body Mass Index: 27.64 kg/m      Reproductive/Obstetrics negative OB ROS                             Anesthesia Physical Anesthesia Plan  ASA: 2  Anesthesia Plan: General   Post-op Pain Management:    Induction: Intravenous  PONV Risk Score and Plan: Propofol infusion and TIVA  Airway Management Planned: Natural Airway  Additional Equipment:   Intra-op Plan:   Post-operative Plan:   Informed Consent: I have reviewed the patients History and  Physical, chart, labs and discussed the procedure including the risks, benefits and alternatives for the proposed anesthesia with the patient or authorized representative who has indicated his/her understanding and acceptance.     Dental Advisory Given  Plan Discussed with: CRNA and Surgeon  Anesthesia Plan Comments:        Anesthesia Quick Evaluation

## 2022-04-30 NOTE — Transfer of Care (Signed)
Immediate Anesthesia Transfer of Care Note  Patient: David Sheppard  Procedure(s) Performed: Procedure(s): COLONOSCOPY WITH PROPOFOL (N/A)  Patient Location: PACU and Endoscopy Unit  Anesthesia Type:General  Level of Consciousness: sedated  Airway & Oxygen Therapy: Patient Spontanous Breathing and Patient connected to nasal cannula oxygen  Post-op Assessment: Report given to RN and Post -op Vital signs reviewed and stable  Post vital signs: Reviewed and stable  Last Vitals:  Vitals:   04/30/22 0648 04/30/22 0802  BP: 120/86 (!) 75/53  Pulse: 93   Resp: 20 16  Temp: (!) 35.7 C   SpO2: [id number]    Complications: No apparent anesthesia complications

## 2022-05-03 ENCOUNTER — Encounter: Payer: Self-pay | Admitting: Gastroenterology

## 2022-06-29 ENCOUNTER — Ambulatory Visit: Payer: BC Managed Care – PPO | Admitting: Family Medicine

## 2022-06-29 ENCOUNTER — Ambulatory Visit: Payer: BC Managed Care – PPO

## 2022-08-18 ENCOUNTER — Encounter: Payer: Self-pay | Admitting: Family Medicine

## 2022-08-19 MED ORDER — SUMATRIPTAN SUCCINATE 100 MG PO TABS: ORAL_TABLET | ORAL | 3 refills | Status: DC

## 2023-02-14 ENCOUNTER — Encounter: Payer: Self-pay | Admitting: Family Medicine

## 2023-02-14 ENCOUNTER — Ambulatory Visit (INDEPENDENT_AMBULATORY_CARE_PROVIDER_SITE_OTHER): Payer: BC Managed Care – PPO | Admitting: Family Medicine

## 2023-02-14 VITALS — BP 120/80 | HR 75 | Temp 97.8°F | Ht 67.5 in | Wt 192.4 lb

## 2023-02-14 DIAGNOSIS — R0981 Nasal congestion: Secondary | ICD-10-CM | POA: Diagnosis not present

## 2023-02-14 DIAGNOSIS — J029 Acute pharyngitis, unspecified: Secondary | ICD-10-CM

## 2023-02-14 MED ORDER — AMOXICILLIN 875 MG PO TABS: 875.0000 mg | ORAL_TABLET | Freq: Two times a day (BID) | ORAL | 0 refills | Status: DC

## 2023-02-14 NOTE — Progress Notes (Unsigned)
    David Sheppard T. Anina Schnake, MD, CAQ Sports Medicine [facility] [address] [place], [address]  Phone: [phone]  FAX: [phone]  David Sheppard - 57 y.o. male  MRN [id number]  Date of Birth: 05-25-1965  Date: 02/14/2023  PCP: Hannah Beat, MD  Referral: Hannah Beat, MD  Chief Complaint  Patient presents with   Sore Throat    Symptoms started last Tuesday Negative Covid Test on Friday   Nasal Congestion   Cough   Subjective:   David Sheppard is a 57 y.o. very pleasant male patient with Body mass index is 29.69 kg/m. who presents with the following:  He presents with 1 week of head congestion, nasal congestion, sore throat.  He does have some mild cough that is nonproductive. He has not had a fever, and he has had no myalgia or polyarthralgia.  Negative COVID test at home  No nausea, vomiting, diarrhea, dysuria, urgency.  1 week Head congestion Sore throat No fever Really tired Muscle aches  Cough - not that bad    Review of Systems is noted in the HPI, as appropriate  Objective:   BP 120/80 (BP Location: Left Arm, Patient Position: Sitting, Cuff Size: Large)   Pulse 75   Temp 97.8 F (36.6 C) (Temporal)   Ht 5' 7.5" (1.715 m)   Wt 192 lb 6 oz (87.3 kg)   SpO2 98%   BMI 29.69 kg/m    Gen: WDWN, NAD. Globally Non-toxic HEENT: Throat clear, w/o exudate, R TM clear, L TM - good landmarks, No fluid present. rhinnorhea.  MMM Frontal sinuses: NT Max sinuses: NT NECK: Anterior cervical  LAD is absent CV: RRR, No M/G/R, cap refill <2 sec PULM: Breathing comfortably in no respiratory distress. no wheezing, crackles, rhonchi   Laboratory and Imaging Data:  Assessment and Plan:     ICD-10-CM   1. Nasal congestion  R09.81     2. Sore throat  J02.9      I think this is most likely a viral syndrome.  Recommended that he continue to follow this conservatively over the next few days.  If he gets worse  or develops a fever at the end of the week, I did send him some antibiotics to hold at the pharmacy from now  Medication Management during today's office visit: Meds ordered this encounter  Medications   amoxicillin (AMOXIL) 875 MG tablet    Sig: Take 1 tablet (875 mg total) by mouth 2 (two) times daily.    Dispense:  20 tablet    Refill:  0   Disposition: No follow-ups on file.  [employer] One speech-to-text software was used for transcription in this dictation.  Possible transcriptional errors can occur using Animal nutritionist.   Signed,  Elpidio Galea. Cinsere Mizrahi, MD   Outpatient Encounter Medications as of 02/14/2023  Medication Sig   amoxicillin (AMOXIL) 875 MG tablet Take 1 tablet (875 mg total) by mouth 2 (two) times daily.   citalopram (CELEXA) 20 MG tablet Take 1 tablet (20 mg total) by mouth daily.   pantoprazole (PROTONIX) 40 MG tablet Take 1 tablet (40 mg total) by mouth daily.   SUMAtriptan (IMITREX) 100 MG tablet TAKE 1 TABLET BY MOUTH AS NEEDED FOR MIGRAINE. MAY REPEAT IN 2 HRS IF NEEDED. MAX 2TABS/DAY   No facility-administered encounter medications on file as of 02/14/2023.

## 2023-02-15 ENCOUNTER — Encounter: Payer: Self-pay | Admitting: Family Medicine

## 2023-03-28 ENCOUNTER — Other Ambulatory Visit: Payer: Self-pay | Admitting: Family Medicine

## 2023-03-29 NOTE — Telephone Encounter (Signed)
 Lvmtcb, sent mychart message

## 2023-03-29 NOTE — Telephone Encounter (Signed)
Please schedule CPE with fasting labs prior for Dr. Copland.  

## 2023-07-04 ENCOUNTER — Other Ambulatory Visit: Payer: Self-pay | Admitting: Family Medicine

## 2023-07-19 NOTE — Progress Notes (Unsigned)
 David Sheppard T. Sevrin Sally, MD, CAQ Sports Medicine [facility] at [facility] [address] [place], [address]  Phone: [phone]  FAX: [phone]  CANE NAEEM - 57 y.o. male  MRN [id number]  Date of Birth: 1965/10/31  Date: 07/20/2023  PCP: Scherrie Curt, MD  Referral: Scherrie Curt, MD  No chief complaint on file.  Patient Care Team: Scherrie Curt, MD as PCP - General (Family Medicine) Subjective:   David Sheppard is a 58 y.o. pleasant patient who presents with the following:  Preventative Health Maintenance Visit:  Health Maintenance Summary Reviewed and updated, unless pt declines services.  Tobacco History Reviewed. Alcohol: No concerns, no excessive use Exercise Habits: Some activity, rec at least 30 mins 5 times a week STD concerns: no risk or activity to increase risk Drug Use: None  David Sheppard is a very nice patient, who I have known for many years.  He is generally healthy patient does have some migraines and has had anxiety in the past.  COVID Shingrix   Health Maintenance  Topic Date Due   COVID-19 Vaccine (1) Never done   Pneumococcal Vaccine 36-46 Years old (1 of 2 - PCV) Never done   Zoster Vaccines- Shingrix (1 of 2) Never done   INFLUENZA VACCINE  10/14/2023   DTaP/Tdap/Td (3 - Td or Tdap) 10/24/2028   Colonoscopy  04/30/2032   Hepatitis C Screening  Completed   HIV Screening  Completed   HPV VACCINES  Aged Out   Meningococcal B Vaccine  Aged Out   Immunization History  Administered Date(s) Administered   Td 06/03/2008   Tdap 10/25/2018   Patient Active Problem List   Diagnosis Date Noted   Generalized anxiety disorder 07/30/2011    Priority: Medium    Panic attacks 07/30/2011    Priority: Medium    Migraine headache 06/04/2008    Priority: Medium    GERD 06/21/2008    Priority: Low   Allergic rhinitis 06/04/2008    Priority: Low   Gallstone pancreatitis     Past Medical History:  Diagnosis  Date   Allergy    Generalized anxiety disorder 07/30/2011   GERD (gastroesophageal reflux disease)    Migraine    Panic attacks 07/30/2011    Past Surgical History:  Procedure Laterality Date   CHOLECYSTECTOMY N/A 03/17/2018   Procedure: LAPAROSCOPIC CHOLECYSTECTOMY;  Surgeon: Mercy Stall, MD;  Location: [facility] ORS;  Service: General;  Laterality: N/A;   COLONOSCOPY WITH PROPOFOL  N/A 04/30/2022   Procedure: COLONOSCOPY WITH PROPOFOL ;  Surgeon: Luke Salaam, MD;  Location: [facility] ENDOSCOPY;  Service: Gastroenterology;  Laterality: N/A;   HERNIA REPAIR     TONSILLECTOMY AND ADENOIDECTOMY      Family History  Problem Relation Age of Onset   Hypertension Father    Heart disease Paternal Grandfather    Hyperlipidemia Paternal Grandfather    Diabetes Paternal Grandfather    Hyperlipidemia Paternal Grandmother    Diabetes Paternal Grandmother     Social History   Social History Narrative   1 daily caffiene use   Regular exercise-walking          Past Medical History, Surgical History, Social History, Family History, Problem List, Medications, and Allergies have been reviewed and updated if relevant.  Review of Systems: Pertinent positives are listed above.  Otherwise, a full 14 point review of systems has been done in full and it is negative except where it is noted positive.  Objective:   There were no  vitals taken for this visit. Ideal Body Weight:    Ideal Body Weight:   No results found.    03/25/2022    3:41 PM 01/01/2021    2:16 PM 11/26/2019    2:46 PM 10/25/2018    2:42 PM 08/04/2017    2:49 PM  Depression screen PHQ 2/9  Decreased Interest 0 0 0 0 0  Down, Depressed, Hopeless 0 0 0 0 0  PHQ - 2 Score 0 0 0 0 0     GEN: well developed, well nourished, no acute distress Eyes: conjunctiva and lids normal, PERRLA, EOMI ENT: TM clear, nares clear, oral exam WNL Neck: supple, no lymphadenopathy, no thyromegaly, no JVD Pulm: clear to auscultation and percussion,  respiratory effort normal CV: regular rate and rhythm, S1-S2, no murmur, rub or gallop, no bruits, peripheral pulses normal and symmetric, no cyanosis, clubbing, edema or varicosities GI: soft, non-tender; no hepatosplenomegaly, masses; active bowel sounds all quadrants GU: deferred Lymph: no cervical, axillary or inguinal adenopathy MSK: gait normal, muscle tone and strength WNL, no joint swelling, effusions, discoloration, crepitus  SKIN: clear, good turgor, color WNL, no rashes, lesions, or ulcerations Neuro: normal mental status, normal strength, sensation, and motion Psych: alert; oriented to person, place and time, normally interactive and not anxious or depressed in appearance.  All labs reviewed with patient. Results for orders placed or performed in visit on 03/25/22  PSA, Total with Reflex to PSA, Free   Collection Time: 03/25/22  4:01 PM  Result Value Ref Range   PSA, Total 0.4 < OR = 4.0 ng/mL  Hemoglobin A1c   Collection Time: 03/25/22  4:01 PM  Result Value Ref Range   Hgb A1c MFr Bld 5.3 4.6 - 6.5 %  CBC with Differential/Platelet   Collection Time: 03/25/22  4:01 PM  Result Value Ref Range   WBC 7.1 4.0 - 10.5 K/uL   RBC 4.67 4.22 - 5.81 Mil/uL   Hemoglobin 15.2 13.0 - 17.0 g/dL   HCT 16.1 09.6 - 04.5 %   MCV 93.3 78.0 - 100.0 fl   MCHC 34.8 30.0 - 36.0 g/dL   RDW 40.9 81.1 - 91.4 %   Platelets 220.0 150.0 - 400.0 K/uL   Neutrophils Relative % 60.9 43.0 - 77.0 %   Lymphocytes Relative 27.7 12.0 - 46.0 %   Monocytes Relative 7.7 3.0 - 12.0 %   Eosinophils Relative 2.7 0.0 - 5.0 %   Basophils Relative 1.0 0.0 - 3.0 %   Neutro Abs 4.3 1.4 - 7.7 K/uL   Lymphs Abs 2.0 0.7 - 4.0 K/uL   Monocytes Absolute 0.5 0.1 - 1.0 K/uL   Eosinophils Absolute 0.2 0.0 - 0.7 K/uL   Basophils Absolute 0.1 0.0 - 0.1 K/uL  Basic metabolic panel   Collection Time: 03/25/22  4:01 PM  Result Value Ref Range   Sodium 138 135 - 145 mEq/L   Potassium 4.4 3.5 - 5.1 mEq/L   Chloride 102  96 - 112 mEq/L   CO2 29 19 - 32 mEq/L   Glucose, Bld 84 70 - 99 mg/dL   BUN 14 6 - 23 mg/dL   Creatinine, Ser 7.82 0.40 - 1.50 mg/dL   GFR 95.62 >13.08 mL/min   Calcium 9.3 8.4 - 10.5 mg/dL  Hepatic function panel   Collection Time: 03/25/22  4:01 PM  Result Value Ref Range   Total Bilirubin 0.5 0.2 - 1.2 mg/dL   Bilirubin, Direct 0.1 0.0 - 0.3 mg/dL   Alkaline Phosphatase  81 39 - 117 U/L   AST 24 0 - 37 U/L   ALT 35 0 - 53 U/L   Total Protein 7.0 6.0 - 8.3 g/dL   Albumin 4.5 3.5 - 5.2 g/dL  Lipid panel   Collection Time: 03/25/22  4:01 PM  Result Value Ref Range   Cholesterol 217 (H) 0 - 200 mg/dL   Triglycerides 161.0 0.0 - 149.0 mg/dL   HDL 96.04 >54.09 mg/dL   VLDL 81.1 0.0 - 91.4 mg/dL   LDL Cholesterol 782 (H) 0 - 99 mg/dL   Total CHOL/HDL Ratio 4    NonHDL 166.61     Assessment and Plan:     ICD-10-CM   1. Healthcare maintenance  Z00.00       Health Maintenance Exam: The patient's preventative maintenance and recommended screening tests for an annual wellness exam were reviewed in full today. Brought up to date unless services declined.  Counselled on the importance of diet, exercise, and its role in overall health and mortality. The patient's FH and SH was reviewed, including their home life, tobacco status, and drug and alcohol status.  Follow-up in 1 year for physical exam or additional follow-up below.  Disposition: No follow-ups on file.  No orders of the defined types were placed in this encounter.  There are no discontinued medications. No orders of the defined types were placed in this encounter.   Signed,  Ranny Bye. Johntavius Shepard, MD   Allergies as of 07/20/2023   No Known Allergies      Medication List        Accurate as of Jul 19, 2023 12:37 PM. If you have any questions, ask your nurse or doctor.          amoxicillin  875 MG tablet Commonly known as: AMOXIL  Take 1 tablet (875 mg total) by mouth 2 (two) times daily.   citalopram  20  MG tablet Commonly known as: CELEXA  TAKE 1 TABLET DAILY   pantoprazole  40 MG tablet Commonly known as: PROTONIX  TAKE 1 TABLET DAILY   SUMAtriptan  100 MG tablet Commonly known as: IMITREX  TAKE 1 TABLET BY MOUTH AS NEEDED FOR MIGRAINE. MAY REPEAT IN 2 HRS IF NEEDED. MAX 2TABS/DAY

## 2023-07-20 ENCOUNTER — Ambulatory Visit (INDEPENDENT_AMBULATORY_CARE_PROVIDER_SITE_OTHER): Admitting: Family Medicine

## 2023-07-20 ENCOUNTER — Encounter: Payer: Self-pay | Admitting: Family Medicine

## 2023-07-20 VITALS — BP 120/80 | HR 79 | Temp 97.7°F | Ht 67.0 in | Wt 188.5 lb

## 2023-07-20 DIAGNOSIS — Z13220 Encounter for screening for lipoid disorders: Secondary | ICD-10-CM | POA: Diagnosis not present

## 2023-07-20 DIAGNOSIS — Z0000 Encounter for general adult medical examination without abnormal findings: Secondary | ICD-10-CM | POA: Diagnosis not present

## 2023-07-20 DIAGNOSIS — Z131 Encounter for screening for diabetes mellitus: Secondary | ICD-10-CM | POA: Diagnosis not present

## 2023-07-20 DIAGNOSIS — Z79899 Other long term (current) drug therapy: Secondary | ICD-10-CM

## 2023-07-20 DIAGNOSIS — Z125 Encounter for screening for malignant neoplasm of prostate: Secondary | ICD-10-CM | POA: Diagnosis not present

## 2023-07-21 ENCOUNTER — Encounter: Payer: Self-pay | Admitting: Family Medicine

## 2023-07-21 LAB — LIPID PANEL
Cholesterol: 212 mg/dL — ABNORMAL HIGH (ref 0–200)
HDL: 45.5 mg/dL (ref >39.00)
LDL Cholesterol: 135 mg/dL — ABNORMAL HIGH (ref 0–99)
NonHDL: 166.5
Total CHOL/HDL Ratio: 5
Triglycerides: 157 mg/dL — ABNORMAL HIGH (ref 0.0–149.0)
VLDL: 31.4 mg/dL (ref 0.0–40.0)

## 2023-07-21 LAB — CBC WITH DIFFERENTIAL/PLATELET
Basophils Absolute: 0.1 10*3/uL (ref 0.0–0.1)
Basophils Relative: 1.2 % (ref 0.0–3.0)
Eosinophils Absolute: 0.2 10*3/uL (ref 0.0–0.7)
Eosinophils Relative: 3.5 % (ref 0.0–5.0)
HCT: 43.6 % (ref 39.0–52.0)
Hemoglobin: 15.1 g/dL (ref 13.0–17.0)
Lymphocytes Relative: 37.4 % (ref 12.0–46.0)
Lymphs Abs: 2.2 10*3/uL (ref 0.7–4.0)
MCHC: 34.7 g/dL (ref 30.0–36.0)
MCV: 94 fl (ref 78.0–100.0)
Monocytes Absolute: 0.5 10*3/uL (ref 0.1–1.0)
Monocytes Relative: 8 % (ref 3.0–12.0)
Neutro Abs: 3 10*3/uL (ref 1.4–7.7)
Neutrophils Relative %: 49.9 % (ref 43.0–77.0)
Platelets: 199 10*3/uL (ref 150.0–400.0)
RBC: 4.64 Mil/uL (ref 4.22–5.81)
RDW: 12.3 % (ref 11.5–15.5)
WBC: 6 10*3/uL (ref 4.0–10.5)

## 2023-07-21 LAB — BASIC METABOLIC PANEL WITH GFR
BUN: 12 mg/dL (ref 6–23)
CO2: 28 meq/L (ref 19–32)
Calcium: 9.2 mg/dL (ref 8.4–10.5)
Chloride: 100 meq/L (ref 96–112)
Creatinine, Ser: 1.05 mg/dL (ref 0.40–1.50)
GFR: 79.71 mL/min (ref >60.00)
Glucose, Bld: 84 mg/dL (ref 70–99)
Potassium: 4.4 meq/L (ref 3.5–5.1)
Sodium: 136 meq/L (ref 135–145)

## 2023-07-21 LAB — HEPATIC FUNCTION PANEL
ALT: 59 U/L — ABNORMAL HIGH (ref 0–53)
AST: 35 U/L (ref 0–37)
Albumin: 4.5 g/dL (ref 3.5–5.2)
Alkaline Phosphatase: 98 U/L (ref 39–117)
Bilirubin, Direct: 0.1 mg/dL (ref 0.0–0.3)
Total Bilirubin: 0.4 mg/dL (ref 0.2–1.2)
Total Protein: 7.3 g/dL (ref 6.0–8.3)

## 2023-07-21 LAB — HEMOGLOBIN A1C: Hgb A1c MFr Bld: 5.3 % (ref 4.6–6.5)

## 2023-07-22 ENCOUNTER — Encounter: Payer: Self-pay | Admitting: Family Medicine

## 2023-07-22 ENCOUNTER — Other Ambulatory Visit: Payer: Self-pay | Admitting: Family Medicine

## 2023-07-22 ENCOUNTER — Encounter: Payer: Self-pay | Admitting: *Deleted

## 2023-07-22 DIAGNOSIS — R7989 Other specified abnormal findings of blood chemistry: Secondary | ICD-10-CM

## 2023-07-22 LAB — PSA, TOTAL WITH REFLEX TO PSA, FREE: PSA, Total: 0.5 ng/mL (ref <=4.0)

## 2023-10-13 ENCOUNTER — Other Ambulatory Visit: Payer: Self-pay | Admitting: Family Medicine

## 2024-02-16 ENCOUNTER — Ambulatory Visit (INDEPENDENT_AMBULATORY_CARE_PROVIDER_SITE_OTHER)

## 2024-02-16 ENCOUNTER — Ambulatory Visit: Admitting: Podiatry

## 2024-02-16 DIAGNOSIS — M2041 Other hammer toe(s) (acquired), right foot: Secondary | ICD-10-CM | POA: Diagnosis not present

## 2024-02-16 DIAGNOSIS — M7751 Other enthesopathy of right foot: Secondary | ICD-10-CM

## 2024-02-16 DIAGNOSIS — M21619 Bunion of unspecified foot: Secondary | ICD-10-CM | POA: Diagnosis not present

## 2024-02-16 DIAGNOSIS — M76821 Posterior tibial tendinitis, right leg: Secondary | ICD-10-CM

## 2024-02-16 DIAGNOSIS — M76822 Posterior tibial tendinitis, left leg: Secondary | ICD-10-CM | POA: Diagnosis not present

## 2024-02-16 DIAGNOSIS — M2042 Other hammer toe(s) (acquired), left foot: Secondary | ICD-10-CM | POA: Diagnosis not present

## 2024-02-16 MED ORDER — TRIAMCINOLONE ACETONIDE 10 MG/ML IJ SUSP
10.0000 mg | Freq: Once | INTRAMUSCULAR | Status: AC
  Administered 2024-02-16: 10 mg via INTRA_ARTICULAR

## 2024-02-16 MED ORDER — PREDNISONE 10 MG PO TABS: ORAL_TABLET | ORAL | 0 refills | Status: DC

## 2024-02-20 ENCOUNTER — Encounter: Payer: Self-pay | Admitting: Podiatry

## 2024-02-20 NOTE — Progress Notes (Signed)
 Subjective:   Patient ID: David Sheppard, male   DOB: 58 y.o.   MRN: [id number]   HPI patient presents stating having a lot of pain in both of his ankles right over left and he had had a history of this and also on the outside of the right ankle.  Patient also has structural deformity of his severe nature with severe just generalized foot pathology does not smoke likes to be active.  States pain has been present for several months    Review of Systems  All other systems reviewed and are negative.       Objective:  Physical Exam Vitals and nursing note reviewed.  Constitutional:      Appearance: He is well-developed.  Pulmonary:     Effort: Pulmonary effort is normal.  Musculoskeletal:        General: Normal range of motion.  Skin:    General: Skin is warm.  Neurological:     Mental Status: He is alert.     Neurovascular status was found to be intact muscle strength was found to be adequate range of motion within normal limits patient is found to have significant flattening of the arch bilateral with inflammation noted and is noted to have inflammation pain around the posterior tibial tendon as it comes down below the medial malleolus bilateral.  Patient has severe foot structure issues with large bunion and digital deformities     Assessment:  Posterior tibial tendinitis acute right over left with pain along with significant foot structural issues and hammertoe bunion deformity     Plan:  H&P all conditions reviewed x-rays reviewed.  Today I went ahead I explained injections risk he wants to do this sterile prep injected the sheath of the posterior tibial tendon as it comes under the medial malleolus bilateral 3 mg dexamethasone  Kenalog  5 mg Xylocaine  and applied fascial brace to lift up the arch properly fitted into the arch.  Patient will be seen back 2 weeks would make good orthotic candidate due to severe foot structure  X-rays indicate that there is severe bunion  deformity severe digital deformities and foot structure deformity bilateral

## 2024-03-01 ENCOUNTER — Ambulatory Visit: Admitting: Podiatry

## 2024-03-30 ENCOUNTER — Encounter: Payer: Self-pay | Admitting: Family Medicine

## 2024-03-30 ENCOUNTER — Ambulatory Visit: Admitting: Family Medicine

## 2024-03-30 VITALS — BP 110/74 | HR 83 | Temp 97.8°F | Ht 67.0 in | Wt 192.6 lb

## 2024-03-30 DIAGNOSIS — R5381 Other malaise: Secondary | ICD-10-CM

## 2024-03-30 DIAGNOSIS — J069 Acute upper respiratory infection, unspecified: Secondary | ICD-10-CM | POA: Diagnosis not present

## 2024-03-30 DIAGNOSIS — J029 Acute pharyngitis, unspecified: Secondary | ICD-10-CM | POA: Diagnosis not present

## 2024-03-30 LAB — POC COVID19 BINAXNOW: SARS Coronavirus 2 Ag: NEGATIVE

## 2024-03-30 LAB — POCT INFLUENZA A/B
Influenza A, POC: NEGATIVE
Influenza B, POC: NEGATIVE

## 2024-03-30 LAB — POCT RAPID STREP A (OFFICE): Rapid Strep A Screen: NEGATIVE

## 2024-03-30 NOTE — Patient Instructions (Signed)
 You have a viral upper respiratory infection. Antibiotics are not needed for this.  Viral infections usually take 7-10 days to resolve. The cough can last a few weeks to go away. Take ibuprofen 400-600mg  2-3 times a day with meals over next few days as needed for sore throat.  Push fluids and plenty of rest. Let us  know if you are not improving as expected, or if you have high fevers (>101.5) or difficulty swallowing or worsening productive cough. Good to see you today. I hope you start feeling better soon. Call clinic with any questions.

## 2024-03-30 NOTE — Assessment & Plan Note (Addendum)
 Swabs all negative today  Anticipate viral URI Possible RSV however not as much mucous production as would be expected.  Supportive measures reviewed - discussed ibuprofen, honey with lemon, salt water gargles.  Update if not improving with treatment.

## 2024-03-30 NOTE — Progress Notes (Signed)
 " Ph: [phone] Fax: [phone]   Patient ID: David Sheppard, male    DOB: [birth year], 59 y.o.   MRN: [id number]  This visit was conducted in person.  BP 110/74 (BP Location: Left Arm, Patient Position: Sitting, Cuff Size: Large)   Pulse 83   Temp 97.8 F (36.6 C) (Oral)   Ht 5' 7 (1.702 m)   Wt 192 lb 9.6 oz (87.4 kg)   SpO2 98%   BMI 30.17 kg/m    CC: ST, malaise, rhinorrhea  Subjective:   HPI: David Sheppard is a 59 y.o. male presenting on 03/30/2024 for Acute Visit (Dry/ sore throat, malaise, hot forehead, cold hands, slight cough, runny nose, onset 1/13//Pt's mom had upper respiratory infection, pt's dad has RSV, pt was just around them last week /)   3d h/o sore throat, hoarseness, feeling hot/feverish, malaise, mild dry cough and rhinorrhea. Slow progression of symptoms, not sudden onset.   No ear or tooth pain, abd pain, nausea, diarrhea, PNDrainage or body aches.  Treating at home with tylenol  without relief. No significant congestion. No significant mucous production.   Has been around sick parents - mom with viral URI, dad with RSV s/p hospitalization last week.   No h/o asthma Ex-smoker quit 2023 - currently vaping.      Relevant past medical, surgical, family and social history reviewed and updated as indicated. Interim medical history since our last visit reviewed. Allergies and medications reviewed and updated. Outpatient Medications Prior to Visit  Medication Sig Dispense Refill   citalopram  (CELEXA ) 20 MG tablet TAKE 1 TABLET DAILY 90 tablet 3   pantoprazole  (PROTONIX ) 40 MG tablet TAKE 1 TABLET DAILY 90 tablet 3   SUMAtriptan  (IMITREX ) 100 MG tablet TAKE 1 TABLET AS NEEDED FOR MIGRAINE. MAY REPEAT IN 2 HOURS IF NEEDED. MAXIMUM 2 TABLETS PER DAY (Patient taking differently: 100 mg every 2 (two) hours as needed. TAKE 1 TABLET AS NEEDED FOR MIGRAINE. MAY REPEAT IN 2 HOURS IF NEEDED. MAXIMUM 2 TABLETS PER DAY) 27 tablet 3   predniSONE  (DELTASONE ) 10  MG tablet 12 day tapering dose (Patient not taking: Reported on 03/30/2024) 48 tablet 0   No facility-administered medications prior to visit.     Per HPI unless specifically indicated in ROS section below Review of Systems  Objective:  BP 110/74 (BP Location: Left Arm, Patient Position: Sitting, Cuff Size: Large)   Pulse 83   Temp 97.8 F (36.6 C) (Oral)   Ht 5' 7 (1.702 m)   Wt 192 lb 9.6 oz (87.4 kg)   SpO2 98%   BMI 30.17 kg/m   Wt Readings from Last 3 Encounters:  03/30/24 192 lb 9.6 oz (87.4 kg)  07/20/23 188 lb 8 oz (85.5 kg)  02/14/23 192 lb 6 oz (87.3 kg)      Physical Exam Vitals and nursing note reviewed.  Constitutional:      Appearance: Normal appearance. He is not ill-appearing.  HENT:     Head: Normocephalic and atraumatic.     Right Ear: Hearing, tympanic membrane, ear canal and external ear normal. There is no impacted cerumen.     Left Ear: Hearing, tympanic membrane, ear canal and external ear normal. There is no impacted cerumen.     Nose: Nose normal. No mucosal edema, congestion or rhinorrhea.     Right Turbinates: Not enlarged or swollen.     Left Turbinates: Not enlarged or swollen.     Right Sinus: No maxillary sinus tenderness or  frontal sinus tenderness.     Left Sinus: No maxillary sinus tenderness or frontal sinus tenderness.     Mouth/Throat:     Mouth: Mucous membranes are moist.     Pharynx: Oropharynx is clear. Posterior oropharyngeal erythema (mild) present. No oropharyngeal exudate.  Eyes:     Extraocular Movements: Extraocular movements intact.     Conjunctiva/sclera: Conjunctivae normal.     Pupils: Pupils are equal, round, and reactive to light.  Cardiovascular:     Rate and Rhythm: Normal rate and regular rhythm.     Pulses: Normal pulses.     Heart sounds: Normal heart sounds. No murmur heard. Pulmonary:     Effort: Pulmonary effort is normal. No respiratory distress.     Breath sounds: Normal breath sounds. No wheezing,  rhonchi or rales.     Comments: Lungs clear  Musculoskeletal:     Cervical back: Normal range of motion and neck supple. No rigidity.     Right lower leg: No edema.     Left lower leg: No edema.  Lymphadenopathy:     Cervical: No cervical adenopathy.  Skin:    General: Skin is warm and dry.     Findings: No rash.  Neurological:     Mental Status: He is alert.  Psychiatric:        Mood and Affect: Mood normal.        Behavior: Behavior normal.       Results for orders placed or performed in visit on 03/30/24  POC COVID-19 BinaxNow   Collection Time: 03/30/24 12:17 PM  Result Value Ref Range   SARS Coronavirus 2 Ag Negative Negative  POCT Influenza A/B   Collection Time: 03/30/24 12:18 PM  Result Value Ref Range   Influenza A, POC Negative Negative   Influenza B, POC Negative Negative  POCT rapid strep A   Collection Time: 03/30/24 12:25 PM  Result Value Ref Range   Rapid Strep A Screen Negative Negative    Assessment & Plan:   Problem List Items Addressed This Visit     Viral URI with cough - Primary   Swabs all negative today  Anticipate viral URI Possible RSV however not as much mucous production as would be expected.  Supportive measures reviewed - discussed ibuprofen, honey with lemon, salt water gargles.  Update if not improving with treatment.       Other Visit Diagnoses       Malaise       Relevant Orders   POC COVID-19 BinaxNow (Completed)   POCT Influenza A/B (Completed)     Sore throat       Relevant Orders   POCT rapid strep A (Completed)        No orders of the defined types were placed in this encounter.   Orders Placed This Encounter  Procedures   POC COVID-19 BinaxNow   POCT Influenza A/B   POCT rapid strep A    Patient Instructions  You have a viral upper respiratory infection. Antibiotics are not needed for this.  Viral infections usually take 7-10 days to resolve. The cough can last a few weeks to go away. Take ibuprofen  400-600mg  2-3 times a day with meals over next few days as needed for sore throat.  Push fluids and plenty of rest. Let us  know if you are not improving as expected, or if you have high fevers (>101.5) or difficulty swallowing or worsening productive cough. Good to see you today. I hope you  start feeling better soon. Call clinic with any questions.   Follow up plan: Return if symptoms worsen or fail to improve.  Anton Blas, MD   "
